# Patient Record
Sex: Female | Born: 1960 | Race: Black or African American | Hispanic: No | Marital: Single | Smoking: Current every day smoker
Health system: Southern US, Community
[De-identification: ages and names within clinical notes are randomized; demographics above are authoritative.]

## PROBLEM LIST (undated history)

## (undated) DIAGNOSIS — E739 Lactose intolerance, unspecified: Secondary | ICD-10-CM

## (undated) DIAGNOSIS — E079 Disorder of thyroid, unspecified: Secondary | ICD-10-CM

## (undated) DIAGNOSIS — I10 Essential (primary) hypertension: Secondary | ICD-10-CM

## (undated) HISTORY — PX: HIP ARTHROPLASTY: SHX981

## (undated) HISTORY — PX: THYROIDECTOMY: SHX17

---

## 2015-07-27 ENCOUNTER — Other Ambulatory Visit: Payer: Self-pay

## 2015-07-27 ENCOUNTER — Emergency Department (HOSPITAL_COMMUNITY)
Admission: EM | Admit: 2015-07-27 | Discharge: 2015-07-27 | Disposition: A | Discharge status: Home / Self Care | Payer: Self-pay | Attending: Emergency Medicine | Admitting: Emergency Medicine

## 2015-07-27 ENCOUNTER — Emergency Department (HOSPITAL_COMMUNITY): Payer: Self-pay

## 2015-07-27 ENCOUNTER — Encounter (HOSPITAL_COMMUNITY): Payer: Self-pay

## 2015-07-27 DIAGNOSIS — I158 Other secondary hypertension: Secondary | ICD-10-CM | POA: Insufficient documentation

## 2015-07-27 DIAGNOSIS — Z9114 Patient's other noncompliance with medication regimen: Secondary | ICD-10-CM | POA: Insufficient documentation

## 2015-07-27 DIAGNOSIS — I159 Secondary hypertension, unspecified: Secondary | ICD-10-CM

## 2015-07-27 DIAGNOSIS — F17210 Nicotine dependence, cigarettes, uncomplicated: Secondary | ICD-10-CM | POA: Insufficient documentation

## 2015-07-27 HISTORY — DX: Disorder of thyroid, unspecified: E07.9

## 2015-07-27 LAB — I-STAT TROPONIN, ED: Troponin i, poc: 0 ng/mL (ref 0.00–0.08)

## 2015-07-27 LAB — I-STAT CHEM 8, ED
BUN: 16 mg/dL (ref 6–20)
CREATININE: 0.8 mg/dL (ref 0.44–1.00)
Calcium, Ion: 1.31 mmol/L — ABNORMAL HIGH (ref 1.12–1.23)
Chloride: 107 mmol/L (ref 101–111)
GLUCOSE: 76 mg/dL (ref 65–99)
HEMATOCRIT: 46 % (ref 36.0–46.0)
Hemoglobin: 15.6 g/dL — ABNORMAL HIGH (ref 12.0–15.0)
POTASSIUM: 3.7 mmol/L (ref 3.5–5.1)
Sodium: 143 mmol/L (ref 135–145)
TCO2: 26 mmol/L (ref 0–100)

## 2015-07-27 LAB — TSH: TSH: 12.853 u[IU]/mL — AB (ref 0.350–4.500)

## 2015-07-27 LAB — T4, FREE: FREE T4: 0.58 ng/dL — AB (ref 0.61–1.12)

## 2015-07-27 MED ORDER — HYDROCHLOROTHIAZIDE 25 MG PO TABS: 25.0000 mg | ORAL_TABLET | Freq: Every day | ORAL | Status: DC

## 2015-07-27 MED ORDER — LEVOTHYROXINE SODIUM 100 MCG PO TABS: 100.0000 ug | ORAL_TABLET | Freq: Every day | ORAL | Status: DC

## 2015-07-27 NOTE — ED Notes (Signed)
Patient here with shortness of breath and feeling light headed for 2 weeks. States that she has recently moved to area and been out of synthroid x 2 weeks. Complains of fatigue as well

## 2015-07-27 NOTE — ED Notes (Signed)
MD at bedside. 

## 2015-07-27 NOTE — ED Provider Notes (Signed)
CSN: [id number]     Arrival date & time 07/27/15  0912 History   First MD Initiated Contact with Patient 07/27/15 [phone]     Chief Complaint  Patient presents with  . Shortness of Breath  . Dizziness      HPI Patient here with shortness of breath and feeling light headed for 2 weeks. States that she has recently moved to area and been out of synthroid x 2 weeks. Complains of fatigue as well Past Medical History  Diagnosis Date  . Thyroid disease    History reviewed. No pertinent past surgical history. No family history on file. Social History  Substance Use Topics  . Smoking status: Current Every Day Smoker    Types: Cigarettes  . Smokeless tobacco: None  . Alcohol Use: None   OB History    No data available     Review of Systems  All other systems reviewed and are negative.     Allergies  Penicillins  Home Medications   Prior to Admission medications   Medication Sig Start Date End Date Taking? Authorizing Provider  hydrochlorothiazide (HYDRODIURIL) 25 MG tablet Take 1 tablet (25 mg total) by mouth daily. 07/27/15   Nelva Nayobert Luchiano Viscomi, MD  levothyroxine (SYNTHROID, LEVOTHROID) 100 MCG tablet Take 1 tablet (100 mcg total) by mouth daily before breakfast. 07/27/15   Nelva Nayobert Dorianna Mckiver, MD  naproxen sodium (ANAPROX) 220 MG tablet Take 220 mg by mouth 2 (two) times daily as needed (pain).   Yes Historical Provider, MD   BP 169/87 mmHg  Pulse 52  Temp(Src) 98.1 F (36.7 C) (Oral)  Resp 20  Ht 5\' 5"  (1.651 m)  Wt 155 lb (70.308 kg)  BMI 25.79 kg/m2  SpO2 99% Physical Exam  Constitutional: She is oriented to person, place, and time. She appears well-developed and well-nourished. No distress.  HENT:  Head: Normocephalic and atraumatic.  Eyes: Pupils are equal, round, and reactive to light.  Neck: Normal range of motion.  Cardiovascular: Normal rate and intact distal pulses.   Pulmonary/Chest: No respiratory distress. She has no wheezes. She has no rales.  Abdominal: Normal  appearance. She exhibits no distension.  Musculoskeletal: Normal range of motion.  Neurological: She is alert and oriented to person, place, and time. No cranial nerve deficit.  Skin: Skin is warm and dry. No rash noted.  Psychiatric: She has a normal mood and affect. Her behavior is normal.  Nursing note and vitals reviewed.   ED Course  Procedures (including critical care time) Labs Review Labs Reviewed  T4, FREE - Abnormal; Notable for the following:    Free T4 0.58 (*)    All other components within normal limits  I-STAT CHEM 8, ED - Abnormal; Notable for the following:    Calcium, Ion 1.31 (*)    Hemoglobin 15.6 (*)    All other components within normal limits  TSH  I-STAT TROPOININ, ED    Imaging Review Dg Chest 2 View  07/27/2015  CLINICAL DATA:  Shortness of breath and lightheadedness for past 3 days, legs feel heavy, elevated blood pressure, off thyroid medication for 2 weeks, smoker EXAM: CHEST  2 VIEW COMPARISON:  Non FINDINGS: Normal heart size, mediastinal contours, and pulmonary vascularity. Lungs minimally hyperinflated but clear. No pulmonary infiltrate, pleural effusion or pneumothorax. Bones unremarkable. IMPRESSION: No acute abnormalities. Electronically Signed   By: Ulyses SouthwardMark  Boles M.D.   On: 07/27/2015 10:20   I have personally reviewed and evaluated these images and lab results as part of my  medical decision-making.   EKG Interpretation   Date/Time:  Saturday July 27 2015 09:21:15 EDT Ventricular Rate:  60 PR Interval:  124 QRS Duration: 80 QT Interval:  424 QTC Calculation: 424 R Axis:   72 Text Interpretation:  Normal sinus rhythm Possible Left atrial enlargement  Anterolateral infarct , age undetermined Abnormal ECG No previous tracing  Confirmed by Cecilie Heidel  MD, Sanaii Caporaso ([id number]) on 07/27/2015 11:33:31 AM      MDM   Final diagnoses:  Non compliance w medication regimen  Secondary hypertension, unspecified        Nelva Nayobert Kayley Zeiders, MD 07/27/15  1147

## 2015-07-27 NOTE — Discharge Instructions (Signed)

## 2015-07-27 NOTE — Care Management Note (Signed)
Case Management Note  Patient Details  Name: Oliver BarreMichelle Puerta MRN: [id number] Date of Birth: [birth year]  Subjective/Objective:    55 y.o. F seen in the ED today for elevated BP and need for Medication refills. CM received consult for Medication assist and PCP referral. Pt reports that she had [facility] her BP and Thyroid condition and was told she would not be able to be seen there for two weeks. CM [place] persue after discharge in order to establish a PCP. She understands that [facility] 9-10:30 am as pts are seen on a first come first served basis. She plans to go to the Medical Office at her part-time job,  where she reports she can have her BP taken on the days she works so she will have a log of her BPs to take to her initial visit when it is scheduled. I also offered that she can go to her local [facility], [facility] or other pharmacy and use an automated BP Cuff and log these BPs as well ( Using same time of day, same arm and same cuff).   Provided MD with $4.00 Medication list.              Action/Plan: No further CM needs at this time.    Expected Discharge Date:                  Expected Discharge Plan:     In-House Referral:     Discharge planning Services  CM Consult, Medication Assistance  Post Acute Care Choice:    Choice offered to:  Patient  DME Arranged:    DME Agency:     HH Arranged:    HH Agency:     Status of Service:  Completed, signed off  If discussed at [employer] Length of Stay Meetings, dates discussed:    Additional Comments:  Yvone NeuCrutchfield, Berlie Hatchel M, RN 07/27/2015, 10:19 AM

## 2015-07-27 NOTE — ED Notes (Addendum)
RN called Jeronimo NormaJeanie, case management since patient has recently moved here and has no insurance. She states she called [facility] and they were full.  States she needs synthroid and BP meds. Has smoked 0.5 pack/day for decades.

## 2015-07-27 NOTE — ED Notes (Signed)
PT ambulated with baseline gait; VSS; A&Ox3; no signs of distress; respirations even and unlabored; skin warm and dry; no questions upon discharge.  

## 2015-09-02 ENCOUNTER — Ambulatory Visit: Payer: Self-pay | Admitting: Family Medicine

## 2015-09-24 ENCOUNTER — Encounter: Payer: Self-pay | Admitting: Family Medicine

## 2015-09-24 ENCOUNTER — Ambulatory Visit (INDEPENDENT_AMBULATORY_CARE_PROVIDER_SITE_OTHER): Payer: Self-pay | Admitting: Family Medicine

## 2015-09-24 VITALS — BP 124/74 | HR 73 | Temp 98.6°F | Resp 18 | Ht 65.0 in | Wt 161.0 lb

## 2015-09-24 DIAGNOSIS — E039 Hypothyroidism, unspecified: Secondary | ICD-10-CM

## 2015-09-24 DIAGNOSIS — I10 Essential (primary) hypertension: Secondary | ICD-10-CM | POA: Insufficient documentation

## 2015-09-24 MED ORDER — LEVOTHYROXINE SODIUM 100 MCG PO TABS: 100.0000 ug | ORAL_TABLET | Freq: Every day | ORAL | 1 refills | Status: DC

## 2015-09-24 MED ORDER — HYDROCHLOROTHIAZIDE 25 MG PO TABS: 25.0000 mg | ORAL_TABLET | Freq: Every day | ORAL | 1 refills | Status: DC

## 2015-09-24 MED FILL — ?LEVOTHYROXINE 100 MCG TAB: 100 | 30 days supply | Qty: 30 | Fill #0

## 2015-09-24 MED FILL — HYDROCHLOROTHIAZIDE 25 MG T: 25 | 30 days supply | Qty: 30 | Fill #0

## 2015-09-24 NOTE — Progress Notes (Signed)
Maria BarreMichelle Fonder, is a 55 y.o. female  UEA:[id number]  YNW:[id number]  DOB - [birth year]  CC:  Chief Complaint  Patient presents with  . Establish Care       HPI: Maria BarreMichelle Boyle is a 55 y.o. female here to establish care. She has a history of hyperthyroidism, surgery with resultant hypothyroidisn and has been on synthroid but recently ran out. She was seen in last June in ED and found to be hypertensive. She was started on HCTZ 25 and restarted on synthroid 100  Her main reason for being here is to get refills on those medications. She currently does not have any type of financial assistance or insurance and would like to delay health maintenance until she arranges that. Her BP today is 124/74.  Health Maintenance:  She reports having PAP and mammogram in 2016. She gets tetanus and flu shots at her job. Her has BS in June was 1479. She does need colon cancer screening soon. She reports smoking 1/2 pack of cigarettes daily and is not ready to quit. She drinks alcohol only on special occasions and denies drug use. She reports a low salt diet, avoiding fried foods. Eat lots of fish and salads. She does not exercise regularly.  Allergies  Allergen Reactions  . Penicillins Rash   Past Medical History:  Diagnosis Date  . Thyroid disease    Current Outpatient Prescriptions on File Prior to Visit  Medication Sig Dispense Refill  . naproxen sodium (ANAPROX) 220 MG tablet Take 220 mg by mouth 2 (two) times daily as needed (pain).     No current facility-administered medications on file prior to visit.    No family history on file. Social History   Social History  . Marital status: Single    Spouse name: N/A  . Number of children: N/A  . Years of education: N/A   Occupational History  . Not on file.   Social History Main Topics  . Smoking status: Current Every Day Smoker    Types: Cigarettes  . Smokeless tobacco: Not on file  . Alcohol use Not on file  . Drug use: Unknown  .  Sexual activity: Not on file   Other Topics Concern  . Not on file   Social History Narrative  . No narrative on file    Review of Systems: Constitutional: Negative for fever, chills, appetite change, weight loss, fatigue. Positive for hot flashes and insomnia Skin: Negative for rashes or lesions of concern. HENT: Negative for ear pain, ear discharge.nose bleeds Eyes: Negative for pain, discharge, redness, itching and visual disturbance. Neck: Negative for pain, stiffness Respiratory: Negative for cough, shortness of breath,   Cardiovascular: Negative for chest pain, palpitations and leg swelling. Gastrointestinal: Negative for abdominal pain, nausea, vomiting, diarrhea, constipations Genitourinary: Negative for dysuria, urgency, frequency, hematuria,  Musculoskeletal: Negative for back pain, joint pain, joint  swelling, and gait problem.Negative for weakness.Positive for left hip pain (DJD) Neurological: Negative for dizziness, tremors, seizures, syncope,   light-headedness, numbness and headaches.  Hematological: Negative for easy bruising or bleeding Psychiatric/Behavioral: Negative for depression, anxiety, decreased concentration, confusion   Objective:   Vitals:   09/24/15 0910  BP: 124/74  Pulse: 73  Resp: 18  Temp: 98.6 F (37 C)    Physical Exam: Constitutional: Patient appears well-developed and well-nourished. No distress. HENT: Normocephalic, atraumatic, External right and left ear normal. Oropharynx is clear and moist.  Eyes: Conjunctivae and EOM are normal. PERRLA, no scleral icterus. Neck: Normal ROM. Neck supple.  No lymphadenopathy, No thyromegaly. CVS: RRR, S1/S2 +, no murmurs, no gallops, no rubs Pulmonary: Effort and breath sounds normal, no stridor, rhonchi, wheezes, rales.  Abdominal: Soft. Normoactive BS,, no distension, tenderness, rebound or guarding.  Musculoskeletal: Normal range of motion. No edema and no tenderness.  Neuro: Alert.Normal muscle  tone coordination. Non-focal Skin: Skin is warm and dry. No rash noted. Not diaphoretic. No erythema. No pallor. Psychiatric: Normal mood and affect. Behavior, judgment, thought content normal.  Lab Results  Component Value Date   HGB 15.6 (H) 07/27/2015   HCT 46.0 07/27/2015   Lab Results  Component Value Date   CREATININE 0.80 07/27/2015   BUN 16 07/27/2015   NA 143 07/27/2015   K 3.7 07/27/2015   CL 107 07/27/2015    No results found for: HGBA1C Lipid Panel  No results found for: CHOL, TRIG, HDL, CHOLHDL, VLDL, LDLCALC     Assessment and plan:   1. Essential hypertension  - hydrochlorothiazide (HYDRODIURIL) 25 MG tablet; Take 1 tablet (25 mg total) by mouth daily.  Dispense: 30 tablet; Refill: 1  2. Hypothyroidism, unspecified hypothyroidism type  - levothyroxine (SYNTHROID, LEVOTHROID) 100 MCG tablet; Take 1 tablet (100 mcg total) by mouth daily before breakfast.  Dispense: 30 tablet; Refill: 1   Return in about 3 months (around 12/25/2015).  The patient was given clear instructions to go to ER or return to medical center if symptoms don't improve, worsen or new problems develop. The patient verbalized understanding.    Henrietta HooverLinda C Mikeila Burgen FNP  09/24/2015, 9:44 AM

## 2015-09-24 NOTE — Patient Instructions (Signed)
Continue current medication Follow-up when you get insurance or orange card.

## 2015-09-24 NOTE — Progress Notes (Signed)
Patient is here to establish care.  Patient denies pain at this time.  Patient has taken medication today. Patient has eaten today.   

## 2015-10-29 MED FILL — ?LEVOTHYROXINE 100 MCG TAB: 100 | 30 days supply | Qty: 30 | Fill #1

## 2015-10-29 MED FILL — HYDROCHLOROTHIAZIDE 25 MG T: 25 | 30 days supply | Qty: 30 | Fill #1

## 2015-11-29 ENCOUNTER — Other Ambulatory Visit: Payer: Self-pay | Admitting: Family Medicine

## 2015-11-29 DIAGNOSIS — I10 Essential (primary) hypertension: Secondary | ICD-10-CM

## 2015-11-29 DIAGNOSIS — E039 Hypothyroidism, unspecified: Secondary | ICD-10-CM

## 2015-11-29 MED FILL — HYDROCHLOROTHIAZIDE 25 MG T: 25 | 30 days supply | Qty: 30 | Fill #0

## 2015-11-29 MED FILL — ?LEVOTHYROXINE 100 MCG TAB: 100 | 30 days supply | Qty: 30 | Fill #0

## 2015-12-24 ENCOUNTER — Ambulatory Visit: Payer: Self-pay | Admitting: Family Medicine

## 2016-01-02 MED FILL — HYDROCHLOROTHIAZIDE 25 MG T: 25 | 30 days supply | Qty: 30 | Fill #1

## 2016-01-02 MED FILL — ?LEVOTHYROXINE 100 MCG TAB: 100 | 30 days supply | Qty: 30 | Fill #1

## 2016-02-05 ENCOUNTER — Other Ambulatory Visit: Payer: Self-pay | Admitting: Family Medicine

## 2016-02-05 DIAGNOSIS — E039 Hypothyroidism, unspecified: Secondary | ICD-10-CM

## 2016-02-05 DIAGNOSIS — I10 Essential (primary) hypertension: Secondary | ICD-10-CM

## 2016-02-07 MED FILL — ?LEVOTHYROXINE 100 MCG TAB: 100 | 30 days supply | Qty: 30 | Fill #0

## 2016-02-07 MED FILL — HYDROCHLOROTHIAZIDE 25 MG T: 25 | 30 days supply | Qty: 30 | Fill #0

## 2016-03-09 MED FILL — HYDROCHLOROTHIAZIDE 25 MG T: 25 | 30 days supply | Qty: 30 | Fill #1

## 2016-03-09 MED FILL — ?LEVOTHYROXINE 100 MCG TAB: 100 | 30 days supply | Qty: 30 | Fill #1

## 2016-04-09 ENCOUNTER — Telehealth: Payer: Self-pay

## 2016-04-09 DIAGNOSIS — I10 Essential (primary) hypertension: Secondary | ICD-10-CM

## 2016-04-09 DIAGNOSIS — E039 Hypothyroidism, unspecified: Secondary | ICD-10-CM

## 2016-04-09 MED ORDER — LEVOTHYROXINE SODIUM 100 MCG PO TABS: ORAL_TABLET | ORAL | 1 refills | Status: DC

## 2016-04-09 MED ORDER — HYDROCHLOROTHIAZIDE 25 MG PO TABS: 25.0000 mg | ORAL_TABLET | Freq: Every day | ORAL | 1 refills | Status: DC

## 2016-04-09 NOTE — Telephone Encounter (Signed)
Called patient and schedule an appointment and refills were sent into pharmacy that will last until appointment

## 2016-04-10 MED FILL — LEVOTHYROXINE 100 MCG TAB: 100 | 30 days supply | Qty: 30 | Fill #0

## 2016-04-10 MED FILL — HYDROCHLOROTHIAZIDE 25 MG T: 25 | 30 days supply | Qty: 30 | Fill #0

## 2016-04-20 ENCOUNTER — Encounter (HOSPITAL_COMMUNITY): Payer: Self-pay | Admitting: Emergency Medicine

## 2016-04-20 ENCOUNTER — Emergency Department (HOSPITAL_COMMUNITY): Payer: Self-pay

## 2016-04-20 ENCOUNTER — Emergency Department (HOSPITAL_COMMUNITY)
Admission: EM | Admit: 2016-04-20 | Discharge: 2016-04-20 | Disposition: A | Discharge status: Home / Self Care | Payer: Self-pay | Attending: Emergency Medicine | Admitting: Emergency Medicine

## 2016-04-20 DIAGNOSIS — I10 Essential (primary) hypertension: Secondary | ICD-10-CM | POA: Insufficient documentation

## 2016-04-20 DIAGNOSIS — M25552 Pain in left hip: Secondary | ICD-10-CM | POA: Insufficient documentation

## 2016-04-20 DIAGNOSIS — F17210 Nicotine dependence, cigarettes, uncomplicated: Secondary | ICD-10-CM | POA: Insufficient documentation

## 2016-04-20 HISTORY — DX: Essential (primary) hypertension: I10

## 2016-04-20 MED ORDER — OXYCODONE-ACETAMINOPHEN 5-325 MG PO TABS: 1.0000 | ORAL_TABLET | ORAL | 0 refills | Status: AC | PRN

## 2016-04-20 MED ORDER — OXYCODONE-ACETAMINOPHEN 5-325 MG PO TABS
1.0000 | ORAL_TABLET | Freq: Once | ORAL | Status: AC
  Administered 2016-04-20: 1 via ORAL
  Filled 2016-04-20: qty 1

## 2016-04-20 MED ORDER — CYCLOBENZAPRINE HCL 10 MG PO TABS: 10.0000 mg | ORAL_TABLET | Freq: Two times a day (BID) | ORAL | 0 refills | Status: DC | PRN

## 2016-04-20 NOTE — ED Notes (Signed)
States since Friday her whole left leg has given her problems, cannot bear weight and pain is in groin and  Radiates down entire leg, aching, no position makes it better, she cannot sleep and it hurts so bad to bear weigfht

## 2016-04-20 NOTE — Progress Notes (Signed)
Orthopedic Tech Progress Note Patient Details:  Maria BarreMichelle Boyle Oct 29, 1960 [id number]  Ortho Devices Type of Ortho Device: Crutches Ortho Device/Splint Location: applied crutches and training for pt left leg.  pt AMBULATED fair. Daughter rushed crutch training because she said she works with physical therapy and she will train her mother when she gets home.  they have to go because she has an appointment. daughter reacted when I attempted to help pt up to stand and when i attempted to tie the pts shoe. Daughter insisted that she will do it. Ortho Device/Splint Interventions: Application, Adjustment   Alvina ChouWilliams, Jametta Moorehead C 04/20/2016, 11:27 AM

## 2016-04-20 NOTE — ED Triage Notes (Signed)
Pt sts left hip and lower back pain with radiation down left leg

## 2016-04-20 NOTE — ED Provider Notes (Signed)
[facility]-EMERGENCY DEPT Provider Note   CSN: [id number] Arrival date & time: 04/20/16  [id number]  By signing my name below, I, Maria Boyle, attest that this documentation has been prepared under the direction and in the presence of Audry Piliyler Isiaha Greenup, PA-C. Electronically Signed: Doreatha MartinEva Boyle, ED Scribe. 04/20/16. 9:28 AM.    History   Chief Complaint Chief Complaint  Patient presents with  . Hip Pain    HPI Maria Boyle is a 56 y.o. female who presents to the Emergency Department complaining of moderate, sharp left lower back pain with radiation to the left hip and knee that began 2 days ago. Pt states a few days ago, she got up from sleep to use the restroom, felt her leg "give out" and has had pain since. She denies recent fall, injury, trauma, heavy lifting. Pt also notes her left knee was swollen initially; however that has now resolved. Pt is ambulatory with minimal difficulty. She states her pain is worsened with weight bearing, ambulation and with certain positions. Pt reports some relief of pain with elevating the left leg. No h/o back surgery, DM. She denies bowel or bladder incontinence, saddle anesthesia, dysuria, hematuria, frequency, urgency. She also denies numbness, focal weakness or paresthesia of the lower extremities.    The history is provided by the patient. No language interpreter was used.    Past Medical History:  Diagnosis Date  . Hypertension   . Thyroid disease     Patient Active Problem List   Diagnosis Date Noted  . Essential hypertension 09/24/2015    History reviewed. No pertinent surgical history.  OB History    No data available       Home Medications    Prior to Admission medications   Medication Sig Start Date End Date Taking? Authorizing Provider  hydrochlorothiazide (HYDRODIURIL) 25 MG tablet Take 1 tablet (25 mg total) by mouth daily. 04/09/16   Henrietta HooverLinda C Bernhardt, NP  levothyroxine (SYNTHROID, LEVOTHROID) 100 MCG tablet TAKE 1 TABLET BY MOUTH DAILY  BEFORE BREAKFAST. 04/09/16   Henrietta HooverLinda C Bernhardt, NP  naproxen sodium (ANAPROX) 220 MG tablet Take 220 mg by mouth 2 (two) times daily as needed (pain).    Historical Provider, MD    Family History History reviewed. No pertinent family history.  Social History Social History  Substance Use Topics  . Smoking status: Current Every Day Smoker    Types: Cigarettes  . Smokeless tobacco: Never Used  . Alcohol use No     Allergies   Penicillins   Review of Systems Review of Systems  Gastrointestinal:       No bowel incontinence   Genitourinary: Negative for dysuria, frequency, hematuria and urgency.       No bladder incontinence, saddle anesthesia  Musculoskeletal: Positive for back pain.  Neurological: Negative for weakness and numbness.       No tingling    Physical Exam Updated Vital Signs BP (!) 162/89 (BP Location: Right Arm)   Pulse 64   Temp 98.3 F (36.8 C) (Oral)   Resp 18   SpO2 100%   Physical Exam  Constitutional: She is oriented to person, place, and time. She appears well-developed and well-nourished. No distress.  HENT:  Head: Normocephalic and atraumatic.  Mouth/Throat: Oropharynx is clear and moist.  Eyes: Conjunctivae are normal.  Neck: Normal range of motion. Neck supple. No spinous process tenderness and no muscular tenderness present.  Cardiovascular: Normal rate and intact distal pulses.   Pulmonary/Chest: Effort normal. No respiratory distress.  Musculoskeletal: She exhibits tenderness. She exhibits no edema.  TTP left lower lumbar musculature. No midline spinous process tenderness.  Left hip: Pain with internal and external rotation. No palpable visible deformities. NVI and DPs appreciated.   Neurological: She is alert and oriented to person, place, and time. She has normal strength. She displays normal reflexes. No sensory deficit.  Strength lower extremities 5/5 and equal bilateral. Sensation intact. Normal gait.  DTRs intact.   Skin: Skin is  warm and dry. No rash noted. She is not diaphoretic.  Psychiatric: She has a normal mood and affect. Her behavior is normal.  Nursing note and vitals reviewed.   ED Treatments / Results   DIAGNOSTIC STUDIES: Oxygen Saturation is 100% on RA, normal by my interpretation.    COORDINATION OF CARE: 9:24 AM Discussed treatment plan with pt at bedside which includes XR and pt agreed to plan.  10:08 AM Case discussed with attending provider Dr. Claiborne Rigg, who recommends ortho follow up.    Radiology Dg Hip Unilat W Or Wo Pelvis 2-3 Views Left  Result Date: 04/20/2016 CLINICAL DATA:  Left groin pain EXAM: DG HIP (WITH OR WITHOUT PELVIS) 2-3V LEFT COMPARISON:  None. FINDINGS: There is severe narrowing of the superior left hip joint with juxta-articular cystic and sclerotic change. There is some flattening of the articular surface of the superior femoral head. No acute fracture. No dislocation. 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 No breakage or loosening of the hardware. 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 Underlying avascular necrosis of the femoral head has the instigating factor cannot be excluded. No acute bony injury. Electronically Signed   By: Jolaine Click M.D.   On: 04/20/2016 09:59    Procedures Procedures (including critical care time)  Medications Ordered in ED Medications  oxyCODONE-acetaminophen (PERCOCET/ROXICET) 5-325 MG per tablet 1 tablet (1 tablet Oral Given 04/20/16 0933)     Initial Impression / Assessment and Plan / ED Course  I have reviewed the triage vital signs and the nursing notes.  Pertinent imaging results that were available during my care of the patient were reviewed by me and considered in my medical decision making (see chart for details).     I have reviewed the relevant imaging studies. I have reviewed the relevant previous healthcare records. I obtained HPI from historian. Patient discussed with  supervising physician.    ED Course: Left hip XR  Assessment: Patient is a 56 y.o. female with a hx of HTN who presents to the ED with back pain. No neurological deficits appreciated. Patient is ambulatory. No warning symptoms of back pain including: fecal incontinence, urinary retention or overflow incontinence, night sweats, waking from sleep with back pain, unexplained fevers or weight loss, h/o cancer, IVDU, recent trauma. No concern for cauda equina, epidural abscess, or other serious cause of back pain.   XR with possible avascular necrosis of the left hip. Will refer to orthopedics for further management. Will provide with crutches and instructions to weight bear as tolerated.  Disposition/Plan:  D/c with rx for Flexeril and 15 tablets Percocet. Reviewed [place] drug database. Additional Verbal discharge instructions given and discussed with patient. Pt Instructed to f/u with orthopedics in the next week for evaluation and treatment of symptoms. Return precautions given. Pt acknowledges and agrees with plan  Supervising Physician Alvira Monday, MD    Final Clinical Impressions(s) / ED Diagnoses   Final diagnoses:  Left hip pain    New Prescriptions New Prescriptions  No medications on file   I personally performed the services described in this documentation, which was scribed in my presence. The recorded information has been reviewed and is accurate.    Audry Pili, PA-C 04/20/16 1038    Alvira Monday, MD 04/20/16 2317

## 2016-04-20 NOTE — ED Notes (Signed)
Pt was fitted for crutches  By ortho

## 2016-04-20 NOTE — Discharge Instructions (Addendum)
Please read and follow all provided instructions.  Your diagnoses today include:  1. Left hip pain     Tests performed today include: Vital signs - see below for your results today  Medications prescribed:   Take any prescribed medications only as directed.  Home care instructions:  Follow any educational materials contained in this packet Please rest, use ice or heat on your back for the next several days Do not lift, push, pull anything more than 10 pounds for the next week  WEIGHT BEAR AS TOLERATED WITH CRUTCHES  Follow-up instructions: Please follow-up with your primary care provider in the next 1 week for further evaluation of your symptoms.   Return instructions:  SEEK IMMEDIATE MEDICAL ATTENTION IF YOU HAVE: New numbness, tingling, weakness, or problem with the use of your arms or legs Severe back pain not relieved with medications Loss control of your bowels or bladder Increasing pain in any areas of the body (such as chest or abdominal pain) Shortness of breath, dizziness, or fainting.  Worsening nausea (feeling sick to your stomach), vomiting, fever, or sweats Any other emergent concerns regarding your health   Additional Information:  Your vital signs today were: BP (!) 162/89 (BP Location: Right Arm)    Pulse 64    Temp 98.3 F (36.8 C) (Oral)    Resp 18    SpO2 100%  If your blood pressure (BP) was elevated above 135/85 this visit, please have this repeated by your doctor within one month. --------------

## 2016-04-23 ENCOUNTER — Ambulatory Visit: Payer: Self-pay | Admitting: Family Medicine

## 2016-05-11 MED FILL — HYDROCHLOROTHIAZIDE 25 MG T: 25 | 30 days supply | Qty: 30 | Fill #1

## 2016-05-11 MED FILL — LEVOTHYROXINE 100 MCG TAB: 100 | 30 days supply | Qty: 30 | Fill #1

## 2016-05-22 ENCOUNTER — Encounter: Payer: Self-pay | Admitting: Family Medicine

## 2016-05-22 ENCOUNTER — Ambulatory Visit (INDEPENDENT_AMBULATORY_CARE_PROVIDER_SITE_OTHER): Payer: Self-pay | Admitting: Family Medicine

## 2016-05-22 VITALS — BP 134/80 | HR 77 | Temp 98.4°F | Resp 16 | Ht 65.0 in | Wt 160.0 lb

## 2016-05-22 DIAGNOSIS — R21 Rash and other nonspecific skin eruption: Secondary | ICD-10-CM

## 2016-05-22 DIAGNOSIS — M25552 Pain in left hip: Secondary | ICD-10-CM

## 2016-05-22 DIAGNOSIS — E039 Hypothyroidism, unspecified: Secondary | ICD-10-CM

## 2016-05-22 DIAGNOSIS — I10 Essential (primary) hypertension: Secondary | ICD-10-CM

## 2016-05-22 LAB — COMPLETE METABOLIC PANEL WITH GFR
ALBUMIN: 4.3 g/dL (ref 3.6–5.1)
ALK PHOS: 83 U/L (ref 33–130)
ALT: 8 U/L (ref 6–29)
AST: 13 U/L (ref 10–35)
BUN: 15 mg/dL (ref 7–25)
CHLORIDE: 105 mmol/L (ref 98–110)
CO2: 30 mmol/L (ref 20–31)
Calcium: 11 mg/dL — ABNORMAL HIGH (ref 8.6–10.4)
Creat: 0.87 mg/dL (ref 0.50–1.05)
GFR, EST AFRICAN AMERICAN: 87 mL/min (ref >=60)
GFR, EST NON AFRICAN AMERICAN: 75 mL/min (ref >=60)
Glucose, Bld: 87 mg/dL (ref 65–99)
POTASSIUM: 3.7 mmol/L (ref 3.5–5.3)
Sodium: 141 mmol/L (ref 135–146)
Total Bilirubin: 0.9 mg/dL (ref 0.2–1.2)
Total Protein: 7.1 g/dL (ref 6.1–8.1)

## 2016-05-22 LAB — CBC WITH DIFFERENTIAL/PLATELET
BASOS PCT: 0 %
Basophils Absolute: 0 cells/uL (ref 0–200)
EOS ABS: 142 {cells}/uL (ref 15–500)
Eosinophils Relative: 2 %
HEMATOCRIT: 42.8 % (ref 35.0–45.0)
Hemoglobin: 14.1 g/dL (ref 11.7–15.5)
LYMPHS PCT: 44 %
Lymphs Abs: 3124 cells/uL (ref 850–3900)
MCH: 31.3 pg (ref 27.0–33.0)
MCHC: 32.9 g/dL (ref 32.0–36.0)
MCV: 95.1 fL (ref 80.0–100.0)
MONO ABS: 426 {cells}/uL (ref 200–950)
MONOS PCT: 6 %
MPV: 11.3 fL (ref 7.5–12.5)
NEUTROS PCT: 48 %
Neutro Abs: 3408 cells/uL (ref 1500–7800)
Platelets: 258 10*3/uL (ref 140–400)
RBC: 4.5 MIL/uL (ref 3.80–5.10)
RDW: 14 % (ref 11.0–15.0)
WBC: 7.1 10*3/uL (ref 3.8–10.8)

## 2016-05-22 LAB — POCT URINALYSIS DIP (DEVICE)
BILIRUBIN URINE: NEGATIVE
GLUCOSE, UA: NEGATIVE mg/dL
Hgb urine dipstick: NEGATIVE
KETONES UR: NEGATIVE mg/dL
Leukocytes, UA: NEGATIVE
Nitrite: NEGATIVE
Protein, ur: NEGATIVE mg/dL
Specific Gravity, Urine: 1.025 (ref 1.005–1.030)
Urobilinogen, UA: 1 mg/dL (ref 0.0–1.0)
pH: 5.5 (ref 5.0–8.0)

## 2016-05-22 LAB — LIPID PANEL
Cholesterol: 263 mg/dL — ABNORMAL HIGH (ref <200)
HDL: 56 mg/dL (ref >50)
LDL Cholesterol: 186 mg/dL — ABNORMAL HIGH (ref <100)
TRIGLYCERIDES: 107 mg/dL (ref <150)
Total CHOL/HDL Ratio: 4.7 Ratio (ref <5.0)
VLDL: 21 mg/dL (ref <30)

## 2016-05-22 MED ORDER — CYCLOBENZAPRINE HCL 10 MG PO TABS: 10.0000 mg | ORAL_TABLET | Freq: Three times a day (TID) | ORAL | 0 refills | Status: DC | PRN

## 2016-05-22 MED ORDER — HYDROCHLOROTHIAZIDE 25 MG PO TABS: 25.0000 mg | ORAL_TABLET | Freq: Every day | ORAL | 1 refills | Status: DC

## 2016-05-22 MED ORDER — CLOTRIMAZOLE-BETAMETHASONE 1-0.05 % EX CREA: 1.0000 "application " | TOPICAL_CREAM | Freq: Two times a day (BID) | CUTANEOUS | 0 refills | Status: DC

## 2016-05-22 MED ORDER — ACETAMINOPHEN 500 MG PO TABS: 1000.0000 mg | ORAL_TABLET | Freq: Four times a day (QID) | ORAL | 0 refills | Status: DC | PRN

## 2016-05-22 MED ORDER — MELOXICAM 15 MG PO TABS
15.0000 mg | ORAL_TABLET | Freq: Every day | ORAL | 2 refills | Status: DC
Start: 2016-05-22 — End: 2017-06-21

## 2016-05-22 NOTE — Patient Instructions (Addendum)
Please complete the [facility] Health Patient Assistance program in order to obtain a referral for orthopedic surgery.  You may fax the form that you needed completed for your license to my attention and indicate where the form needs to be returned to.   Take Meloxicam once daily for hip pain. You may take Tylenol 1,000 mg (2 tablets) every 6 hours for pain relief. Take  Flexeril 10 mg only when significant pain occurs and if you are able to obtain sleep and are not driving.        Hypothyroidism Hypothyroidism is a disorder of the thyroid. The thyroid is a large gland that is located in the lower front of the neck. The thyroid releases hormones that control how the body works. With hypothyroidism, the thyroid does not make enough of these hormones. What are the causes? Causes of hypothyroidism may include:  Viral infections.  Pregnancy.  Your own defense system (immune system) attacking your thyroid.  Certain medicines.  Birth defects.  Past radiation treatments to your head or neck.  Past treatment with radioactive iodine.  Past surgical removal of part or all of your thyroid.  Problems with the gland that is located in the center of your brain (pituitary). What are the signs or symptoms? Signs and symptoms of hypothyroidism may include:  Feeling as though you have no energy (lethargy).  Inability to tolerate cold.  Weight gain that is not explained by a change in diet or exercise habits.  Dry skin.  Coarse hair.  Menstrual irregularity.  Slowing of thought processes.  Constipation.  Sadness or depression. How is this diagnosed? Your health care provider may diagnose hypothyroidism with blood tests and ultrasound tests. How is this treated? Hypothyroidism is treated with medicine that replaces the hormones that your body does not make. After you begin treatment, it may take several weeks for symptoms to go away. Follow these instructions at home:  Take  medicines only as directed by your health care provider.  If you start taking any new medicines, tell your health care provider.  Keep all follow-up visits as directed by your health care provider. This is important. As your condition improves, your dosage needs may change. You will need to have blood tests regularly so that your health care provider can watch your condition. Contact a health care provider if:  Your symptoms do not get better with treatment.  You are taking thyroid replacement medicine and:  You sweat excessively.  You have tremors.  You feel anxious.  You lose weight rapidly.  You cannot tolerate heat.  You have emotional swings.  You have diarrhea.  You feel weak. Get help right away if:  You develop chest pain.  You develop an irregular heartbeat.  You develop a rapid heartbeat. This information is not intended to replace advice given to you by your health care provider. Make sure you discuss any questions you have with your health care provider. Document Released: 01/19/2005 Document Revised: 06/27/2015 Document Reviewed: 06/06/2013 Elsevier Interactive Patient Education  2017 Elsevier Inc.  Hypertension Hypertension is another name for high blood pressure. High blood pressure forces your heart to work harder to pump blood. This can cause problems over time. There are two numbers in a blood pressure reading. There is a top number (systolic) over a bottom number (diastolic). It is best to have a blood pressure below 120/80. Healthy choices can help lower your blood pressure. You may need medicine to help lower your blood pressure if:  Your  blood pressure cannot be lowered with healthy choices.  Your blood pressure is higher than 130/80. Follow these instructions at home: Eating and drinking   If directed, follow the DASH eating plan. This diet includes:  Filling half of your plate at each meal with fruits and vegetables.  Filling one quarter of  your plate at each meal with whole grains. Whole grains include whole wheat pasta, brown rice, and whole grain bread.  Eating or drinking low-fat dairy products, such as skim milk or low-fat yogurt.  Filling one quarter of your plate at each meal with low-fat (lean) proteins. Low-fat proteins include fish, skinless chicken, eggs, beans, and tofu.  Avoiding fatty meat, cured and processed meat, or chicken with skin.  Avoiding premade or processed food.  Eat less than 1,500 mg of salt (sodium) a day.  Limit alcohol use to no more than 1 drink a day for nonpregnant women and 2 drinks a day for men. One drink equals 12 oz of beer, 5 oz of wine, or 1 oz of hard liquor. Lifestyle   Work with your doctor to stay at a healthy weight or to lose weight. Ask your doctor what the best weight is for you.  Get at least 30 minutes of exercise that causes your heart to beat faster (aerobic exercise) most days of the week. This may include walking, swimming, or biking.  Get at least 30 minutes of exercise that strengthens your muscles (resistance exercise) at least 3 days a week. This may include lifting weights or pilates.  Do not use any products that contain nicotine or tobacco. This includes cigarettes and e-cigarettes. If you need help quitting, ask your doctor.  Check your blood pressure at home as told by your doctor.  Keep all follow-up visits as told by your doctor. This is important. Medicines   Take over-the-counter and prescription medicines only as told by your doctor. Follow directions carefully.  Do not skip doses of blood pressure medicine. The medicine does not work as well if you skip doses. Skipping doses also puts you at risk for problems.  Ask your doctor about side effects or reactions to medicines that you should watch for. Contact a doctor if:  You think you are having a reaction to the medicine you are taking.  You have headaches that keep coming back (recurring).  You  feel dizzy.  You have swelling in your ankles.  You have trouble with your vision. Get help right away if:  You get a very bad headache.  You start to feel confused.  You feel weak or numb.  You feel faint.  You get very bad pain in your:  Chest.  Belly (abdomen).  You throw up (vomit) more than once.  You have trouble breathing. Summary  Hypertension is another name for high blood pressure.  Making healthy choices can help lower blood pressure. If your blood pressure cannot be controlled with healthy choices, you may need to take medicine. This information is not intended to replace advice given to you by your health care provider. Make sure you discuss any questions you have with your health care provider. Document Released: 07/08/2007 Document Revised: 12/18/2015 Document Reviewed: 12/18/2015 Elsevier Interactive Patient Education  2017 Elsevier Inc.        Hip Pain The hip is the joint between the upper legs and the lower pelvis. The bones, cartilage, tendons, and muscles of your hip joint support your body and allow you to move around. Hip pain can  range from a minor ache to severe pain in one or both of your hips. The pain may be felt on the inside of the hip joint near the groin, or the outside near the buttocks and upper thigh. You may also have swelling or stiffness. Follow these instructions at home: Managing pain, stiffness, and swelling   If directed, apply ice to the injured area.  Put ice in a plastic bag.  Place a towel between your skin and the bag.  Leave the ice on for 20 minutes, 2-3 times a day  Sleep with a pillow between your legs on your most comfortable side.  Avoid any activities that cause pain. General instructions   Take over-the-counter and prescription medicines only as told by your health care provider.  Do any exercises as told by your health care provider.  Record the following:  How often you have hip pain.  The  location of your pain.  What the pain feels like.  What makes the pain worse.  Keep all follow-up visits as told by your health care provider. This is important. Contact a health care provider if:  You cannot put weight on your leg.  Your pain or swelling continues or gets worse after one week.  It gets harder to walk.  You have a fever. Get help right away if:  You fall.  You have a sudden increase in pain and swelling in your hip.  Your hip is red or swollen or very tender to touch. Summary  Hip pain can range from a minor ache to severe pain in one or both of your hips.  The pain may be felt on the inside of the hip joint near the groin, or the outside near the buttocks and upper thigh.  Avoid any activities that cause pain.  Record how often you have hip pain, the location of the pain, what makes it worse and what it feels like. This information is not intended to replace advice given to you by your health care provider. Make sure you discuss any questions you have with your health care provider. Document Released: 07/09/2009 Document Revised: 12/23/2015 Document Reviewed: 12/23/2015 Elsevier Interactive Patient Education  2017 [employer].

## 2016-05-22 NOTE — Progress Notes (Signed)
Patient ID: Maria Boyle, female    DOB: 1960-02-23, 56 y.o.   MRN: [id number]  PCP: Joaquin Courts, FNP  Chief Complaint  Patient presents with  . Establish Care    paperwork for bus driver    Subjective:  HPI  Maria Boyle is a 56 y.o. female presents to establish care and routine wellness exam. Medical problems include: Chronic left hip pain, Hypertension, and Hypothyroidism. She last seen in clinic 09/2015. Her last thyroid panel was evaluated in 07/2015. She was recently evaluated at [facility] ED department for left hip pain. She reports that she was advised that her hip would require surgery to correct the chronic pain caused by osteoarthritis. Right hip was replaced several years ago in [place]. 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 Requests to be screened today for diabetes as she recently began to experience neuropathy in her toes over 1 month ago. She reports having a strong family hx of diabetes. Diagnosed with hypertension a little over year ago and reports compliance with medication.  Recently broke out in small rash over the last couple days with red spots on her arms. Denies any recent changes in detergent, foods, or cosmetic products. The rash is on the her bilateral forearms only. Denies itching. Only small red bumps present. Maria Boyle denies any recent headaches, chest pain, shortness of breath, or dizziness.  Social History   Social History  . Marital status: Single    Spouse name: N/A  . Number of children: N/A  . Years of education: N/A   Occupational History  . Not on file.   Social History Main Topics  . Smoking status: Current Every Day Smoker    Types: Cigarettes  . Smokeless tobacco: Never Used  . Alcohol use No  . Drug use: No  . Sexual activity: Not on file   Other Topics Concern  . Not on file   Social History Narrative  . No narrative on file    History reviewed. No pertinent  family history.   Review of Systems  See HPI  Patient Active Problem List   Diagnosis Date Noted  . Essential hypertension 09/24/2015    Allergies  Allergen Reactions  . Penicillins Rash    Prior to Admission medications   Medication Sig Start Date End Date Taking? Authorizing Provider  hydrochlorothiazide (HYDRODIURIL) 25 MG tablet Take 1 tablet (25 mg total) by mouth daily. 04/09/16  Yes Henrietta Hoover, NP  levothyroxine (SYNTHROID, LEVOTHROID) 100 MCG tablet TAKE 1 TABLET BY MOUTH DAILY BEFORE BREAKFAST. 04/09/16  Yes Henrietta Hoover, NP  cyclobenzaprine (FLEXERIL) 10 MG tablet Take 1 tablet (10 mg total) by mouth 2 (two) times daily as needed for muscle spasms. Patient not taking: Reported on 05/22/2016 04/20/16   Audry Pili, PA-C  naproxen sodium (ANAPROX) 220 MG tablet Take 220 mg by mouth 2 (two) times daily as needed (pain).    Historical Provider, MD    Past Medical, Surgical Family and Social History reviewed and updated.    Objective:   Today's Vitals   05/22/16 1444  BP: 134/80  Pulse: 77  Resp: 16  Temp: 98.4 F (36.9 C)  TempSrc: Oral  SpO2: 99%  Weight: 160 lb (72.6 kg)  Height:  (1.651 m)    Wt Readings from Last 3 Encounters:  05/22/16 160 lb (72.6 kg)  09/24/15 161 lb (73 kg)  07/27/15 155 lb (70.3 kg)    Physical Exam  Constitutional: She is oriented to person, place, and time. She appears well-developed and well-nourished.  HENT:  Head: Normocephalic and atraumatic.  Nose: Nose normal.  Mouth/Throat: Oropharynx is clear and moist.  Eyes: Conjunctivae are normal. Pupils are equal, round, and reactive to light.  Neck: Normal range of motion. Neck supple. No thyromegaly present.  Cardiovascular: Normal rate, normal heart sounds and intact distal pulses.   Pulmonary/Chest: Effort normal and breath sounds normal.  Musculoskeletal: She exhibits no tenderness.       Left hip: She exhibits decreased range of motion.  Neurological: She is  alert and oriented to person, place, and time.  Skin: Skin is warm and dry.  Psychiatric: She has a normal mood and affect. Her behavior is normal. Judgment and thought content normal.      Assessment & Plan:  1. Essential hypertension, controlled today - hydrochlorothiazide (HYDRODIURIL) 25 MG tablet; Take 1 tablet (25 mg total) by mouth daily.  Dispense: 90 tablet; Refill: 1 - COMPLETE METABOLIC PANEL WITH GFR - CBC with Differential - Lipid panel - Hemoglobin A1c  2. Hypothyroidism, unspecified type - Thyroid Panel With TSH-TSH 0.30, reducing Levothyroxine to 75 mcg from 100 mcg. -Return in 6 week for a repeat TSH  3. Pain of left hip joint -Complete financial assistance form in order for you to be referred to orthopedics for further evaluation of hip pain -Start Meloxicam 15 mg daily -Intermitediate severe pain, take cyclobenzaprine 10 mg 3 times daily as needed. -Mild to moderate pain take acetaminophen 1,000 mg every 6 hours as needed.  4. Skin Rash -dermatitis likely-trial with Lotrisone cream, twice daily   RTC: 6 months for routine follow-up and to address overdue health maintenance items. RTC: 6 weeks for repeat TSH.   Godfrey Pick. Tiburcio Pea, MSN, [facility] [address] [place], [place] [address] [phone]

## 2016-05-23 LAB — THYROID PANEL WITH TSH
FREE THYROXINE INDEX: 3.5 (ref 1.4–3.8)
T3 UPTAKE: 32 % (ref 22–35)
T4 TOTAL: 11 ug/dL (ref 4.5–12.0)
TSH: 0.3 mIU/L — ABNORMAL LOW

## 2016-05-24 DIAGNOSIS — E039 Hypothyroidism, unspecified: Secondary | ICD-10-CM | POA: Insufficient documentation

## 2016-05-24 MED ORDER — LEVOTHYROXINE SODIUM 75 MCG PO TABS: 75.0000 ug | ORAL_TABLET | Freq: Every day | ORAL | 0 refills | Status: DC

## 2016-05-26 LAB — HEMOGLOBIN A1C
HEMOGLOBIN A1C: 4.9 % (ref <5.7)
MEAN PLASMA GLUCOSE: 94 mg/dL

## 2016-06-18 MED FILL — CLOTRIMAZOLE-BETAMETHASONE: 1-0.05 | 15 days supply | Qty: 30 | Fill #0

## 2016-06-18 MED FILL — ?MELOXICAM 15 MG TAB: 15 MG | 30 days supply | Qty: 30 | Fill #0

## 2016-06-18 MED FILL — ?LEVOTHYROXINE 75 MCG TABLE: 75 | 30 days supply | Qty: 30 | Fill #0

## 2016-06-18 MED FILL — CYCLOBENZAPRINE 10 MG TAB: 10 | 30 days supply | Qty: 90 | Fill #0

## 2016-06-22 ENCOUNTER — Telehealth: Payer: Self-pay

## 2016-06-22 DIAGNOSIS — I10 Essential (primary) hypertension: Secondary | ICD-10-CM

## 2016-06-22 MED ORDER — HYDROCHLOROTHIAZIDE 25 MG PO TABS: 25.0000 mg | ORAL_TABLET | Freq: Every day | ORAL | 1 refills | Status: DC

## 2016-06-22 MED FILL — HYDROCHLOROTHIAZIDE 25 MG T: 25 | 30 days supply | Qty: 30 | Fill #0

## 2016-06-22 NOTE — Telephone Encounter (Signed)
Medication sent to [facility].

## 2016-07-03 ENCOUNTER — Other Ambulatory Visit: Payer: Self-pay

## 2016-07-20 MED FILL — ?LEVOTHYROXINE 75 MCG TABLE: 75 | 30 days supply | Qty: 30 | Fill #1

## 2016-07-20 MED FILL — HYDROCHLOROTHIAZIDE 25 MG T: 25 | 30 days supply | Qty: 30 | Fill #1

## 2016-08-19 MED FILL — LEVOTHYROXINE 75 MCG TABLET: 75 | 30 days supply | Qty: 30 | Fill #2

## 2016-08-19 MED FILL — HYDROCHLOROTHIAZIDE 25 MG T: 25 | 30 days supply | Qty: 30 | Fill #2

## 2016-09-03 ENCOUNTER — Telehealth: Payer: Self-pay

## 2016-09-03 ENCOUNTER — Encounter: Payer: Self-pay | Admitting: Family Medicine

## 2016-09-03 ENCOUNTER — Ambulatory Visit (INDEPENDENT_AMBULATORY_CARE_PROVIDER_SITE_OTHER): Payer: Self-pay | Admitting: Family Medicine

## 2016-09-03 VITALS — BP 124/66 | HR 60 | Temp 98.2°F | Resp 14 | Ht 65.0 in | Wt 161.4 lb

## 2016-09-03 DIAGNOSIS — R053 Chronic cough: Secondary | ICD-10-CM

## 2016-09-03 DIAGNOSIS — E039 Hypothyroidism, unspecified: Secondary | ICD-10-CM

## 2016-09-03 DIAGNOSIS — R946 Abnormal results of thyroid function studies: Secondary | ICD-10-CM

## 2016-09-03 DIAGNOSIS — I10 Essential (primary) hypertension: Secondary | ICD-10-CM

## 2016-09-03 DIAGNOSIS — M25552 Pain in left hip: Secondary | ICD-10-CM

## 2016-09-03 DIAGNOSIS — R05 Cough: Secondary | ICD-10-CM

## 2016-09-03 DIAGNOSIS — R21 Rash and other nonspecific skin eruption: Secondary | ICD-10-CM

## 2016-09-03 MED ORDER — HYDROCHLOROTHIAZIDE 25 MG PO TABS: 25.0000 mg | ORAL_TABLET | Freq: Every day | ORAL | 1 refills | Status: DC

## 2016-09-03 MED ORDER — TRAMADOL HCL 50 MG PO TABS: 50.0000 mg | ORAL_TABLET | Freq: Three times a day (TID) | ORAL | 0 refills | Status: DC | PRN

## 2016-09-03 MED ORDER — LEVOTHYROXINE SODIUM 75 MCG PO TABS: 75.0000 ug | ORAL_TABLET | Freq: Every day | ORAL | 0 refills | Status: DC

## 2016-09-03 MED FILL — HYDROCHLOROTHIAZIDE 25 MG T: 25 | 30 days supply | Qty: 30 | Fill #0

## 2016-09-03 NOTE — Progress Notes (Signed)
Patient ID: Maria BarreMichelle Alcantar, female    DOB: Sep 25, 1960, 56 y.o.   MRN: [id number]  PCP: Bing NeighborsHarris, Gilman Olazabal S, FNP  Chief Complaint  Patient presents with  . Follow-up    6 MONTH   . Hip Pain    Subjective:  HPI Maria BarreMichelle Boyle is a 56 y.o. female presents for routine six month follow-up of chronic illness. Medical problems include: Chronic left hip pain, hx right hip replacement, hypothyroidism, hypertension, and current everyday tobacco use. Maria DusterMichelle suffers from hypertension and is currently prescribed HCTZ. Blood pressures have remained stable. Maria JesterMichele makes efforts to adhere to low sodium diet. She continues to smoke cigarettes daily. Maria DusterMichelle suffers from chronic left hip pain secondary to osteoarthritis which limits her ability to engage in physical exercise. Maria DusterMichelle is uninsured and due to financial constraints, she has not followed up with an Investment banker, operationalorthopedic surgeon. Maria DusterMichelle also reports that she has suffered from a recent cough, nonproductive, over several weeks. She thinks that her apartment building has mold. She has npt attempted relief with any antihistamines or over the counter antitussive medications. Denies associated shortness of breath, wheezing, or chest tightness. Maria DusterMichelle was diagnosed with scabies around 2-3 months prior. She reports that the entire building where she resides had to be treated due to an exposure. She is concern as she continues to have a rash on her lower bilateral legs. The rash is non pustular, dry, and occasionally itches. She reports treatment with Permethrin cream, twice and other lesions resolved with treatment. Social History   Social History  . Marital status: Single    Spouse name: N/A  . Number of children: N/A  . Years of education: N/A   Occupational History  . Not on file.   Social History Main Topics  . Smoking status: Current Every Day Smoker    Types: Cigarettes  . Smokeless tobacco: Never Used  . Alcohol use No  . Drug use: No  .  Sexual activity: Not on file   Other Topics Concern  . Not on file   Social History Narrative  . No narrative on file   Family History  Problem Relation Age of Onset  . Family history unknown: Yes    Review of Systems See HPI  Patient Active Problem List   Diagnosis Date Noted  . Hypothyroidism 05/24/2016  . Essential hypertension 09/24/2015    Allergies  Allergen Reactions  . Penicillins Rash    Prior to Admission medications   Medication Sig Start Date End Date Taking? Authorizing Provider  acetaminophen (TYLENOL) 500 MG tablet Take 2 tablets (1,000 mg total) by mouth every 6 (six) hours as needed. 05/22/16  Yes Bing NeighborsHarris, Dade Rodin S, FNP  clotrimazole-betamethasone (LOTRISONE) cream Apply 1 application topically 2 (two) times daily. For skin rash. 05/22/16  Yes Bing NeighborsHarris, Birl Lobello S, FNP  cyclobenzaprine (FLEXERIL) 10 MG tablet Take 1 tablet (10 mg total) by mouth 3 (three) times daily as needed for muscle spasms. 05/22/16  Yes Bing NeighborsHarris, Kaan Tosh S, FNP  hydrochlorothiazide (HYDRODIURIL) 25 MG tablet Take 1 tablet (25 mg total) by mouth daily. 06/22/16  Yes Bing NeighborsHarris, Haivyn Oravec S, FNP  levothyroxine (SYNTHROID, LEVOTHROID) 75 MCG tablet Take 1 tablet (75 mcg total) by mouth daily. 05/24/16  Yes Bing NeighborsHarris, Keigen Caddell S, FNP  meloxicam (MOBIC) 15 MG tablet Take 1 tablet (15 mg total) by mouth daily. 05/22/16  Yes Bing NeighborsHarris, Benay Pomeroy S, FNP  naproxen sodium (ANAPROX) 220 MG tablet Take 220 mg by mouth 2 (two) times daily as needed (pain).   Yes [provider]    Past Medical, Surgical Family and Social History reviewed and updated.    Objective:   Today's Vitals   09/03/16 0947  BP: 124/66  Pulse: 60  Resp: 14  Temp: 98.2 F (36.8 C)  TempSrc: Oral  SpO2: 100%  Weight: 161 lb 6.4 oz (73.2 kg)  Height: 5\' 5"  (1.651 m)    Wt Readings from Last 3 Encounters:  09/03/16 161 lb 6.4 oz (73.2 kg)  05/22/16 160 lb (72.6 kg)  09/24/15 161 lb (73 kg)   Physical Exam  Constitutional:  She is oriented to person, place, and time. She appears well-developed and well-nourished.  HENT:  Head: Normocephalic and atraumatic.  Eyes: Pupils are equal, round, and reactive to light. Conjunctivae and EOM are normal.  Neck: Normal range of motion. Neck supple. No thyromegaly present.  Cardiovascular: Normal rate and regular rhythm.   Pulmonary/Chest: Effort normal and breath sounds normal.  Musculoskeletal: She exhibits no edema or deformity.       Left hip: She exhibits decreased strength and tenderness.  Neurological: She is alert and oriented to person, place, and time.  Skin: Skin is warm and dry.  Psychiatric: She has a normal mood and affect. Her behavior is normal. Judgment and thought content normal.   Assessment & Plan:  1. Hypothyroidism, unspecified type, previously TSH was below normal. Levothyroxine dose was reduced. Will repeat thyroid panel today. For now, continue current dose of levothyroxine. - Thyroid Panel With TSH  2. Chronic cough, - DG Chest 2 View; Future -recommended OTC antihistamine for  post nasal drip and or antitussive OTC  -encouraged smoking cessation  3. Hypercalcemia,  - PTH, Intact and Calcium   4. Essential hypertension, controlled -Continue Hydrochlorothiazide.  5. Rash, likely dermatitis  - Apply Lotrisone cream, (previously prescribed)  twice daily to affected area.  6. Left hip pain  -Will trial Tramadol 50 mg every 6 hours as needed for moderate pain. -Patient was given [facility] patient assistance application to complete in order to receive an orthopedic surgery referral.  RTC: 6 weeks to follow up hip pain, rash, and cough.  Godfrey PickKimberly S. Tiburcio PeaHarris, MSN, FNP-C [facility]  [address] [place], [place] [id number] [phone]

## 2016-09-03 NOTE — Patient Instructions (Signed)
Complete the [facility] Patient Assistance Application. I am referring you to orthopedic surgery for evaluation of left hip pain.  [facility] today to obtain chest x-ray for evaluation of cough. You will be contact regarding image and notified of recommended therapy after I review image.   For hip pain, I am prescribing Tramadol 50 mg every 8 hours as needed.    Avascular Necrosis-Left HIP  Avascular necrosis is a disease resulting from the temporary or permanent loss of blood supply to a bone. This disease may also be known as:  Osteonecrosis.  Aseptic necrosis.  Ischemic bone necrosis.  Without proper blood supply, the internal layer of the affected bone dies and the outer layer of the bone may break down. If this process affects a bone near a joint, it may lead to collapse of that joint. Common bones that are affected by this condition include:  The top of your thigh bone (femoral head).  One or more bones in your wrist (scaphoid orlunate).  One or more bones in your foot (metatarsals).  One of the bones in your ankle (navicular).  The joint most commonly affected by this condition is the hip joint. Avascular necrosis rarely occurs in more than one bone at a time. What are the causes?  Damage or injury to a bone or joint.  Using corticosteroid medicine for a long period of time.  Changes in your immune or hormone systems.  Excessive exposure to radiation. What increases the risk?  Alcohol abuse.  Previous traumatic injury to a joint.  Using corticosteroid medicines for a long period of time or often.  Having a medical condition such as: ? HIV or AIDS. ? Diabetes. ? Sickle cell disease. ? An autoimmune disease. What are the signs or symptoms? The main symptoms of avascular necrosis are pain and decreased motion in the affected bone or joint. In the early stages the pain may be minor and occur only with activity. As avascular  necrosis progresses, pain may gradually worsen and occur while at rest. The pain may suddenly become severe if an affected joint collapses. How is this diagnosed? Avascular necrosis may be diagnosed with:  A medical history.  A physical exam.  X-rays.  An MRI.  A bone scan.  How is this treated? Treatments may include:  Medicine to help relieve pain.  Avoiding placing any pressure or weight ontheaffected area. If avascular necrosis occurs in your hip, ankle, or foot, you may be instructed to use crutches or a rolling scooter.  Surgery, such as: ? Core decompression. In this surgery, one or more holes are placed in the bone for new blood vessels to grow into. This provides a renewed blood supply to the bone. Core decompression can often reduce pain and pressure in the affected bone and slow the progression of bone and joint destruction. ? Osteotomy. In this surgery, the bone is reshaped to reduce stress on the affected area of the joint. ? Bone grafting. In this surgery, healthy bone from one part of your body is transplanted to the affected area. ? Arthroplasty. Arthroplasty is also known as total joint replacement. In this surgery, the affected surface on one or both sides of a joint is replaced with artificial parts (prostheses).  Electrical stimulation. This may help encourage new bone growth.  Follow these instructions at home:  Take medicines only as directed by your health care provider.  Follow your health care provider's recommendations on limiting activities or using crutches  to rest your affected joint.  Meet with aphysical therapist as directed by your health care provider.  Keep all follow-up visits as directed by your health care provider. This is important. Contact a health care provider if:  Your pain worsens.  You have decreased motion in your affected joint. Get help right away if: Your pain suddenly becomes severe. This information is not intended to  replace advice given to you by your health care provider. Make sure you discuss any questions you have with your health care provider. Document Released: 07/11/2001 Document Revised: 06/27/2015 Document Reviewed: 03/29/2013 Elsevier Interactive Patient Education  2018 Elsevier Inc.    Cough, Adult A cough helps to clear your throat and lungs. A cough may last only 2-3 weeks (acute), or it may last longer than 8 weeks (chronic). Many different things can cause a cough. A cough may be a sign of an illness or another medical condition. Follow these instructions at home:  Pay attention to any changes in your cough.  Take medicines only as told by your doctor. ? If you were prescribed an antibiotic medicine, take it as told by your doctor. Do not stop taking it even if you start to feel better. ? Talk with your doctor before you try using a cough medicine.  Drink enough fluid to keep your pee (urine) clear or pale yellow.  If the air is dry, use a cold steam vaporizer or humidifier in your home.  Stay away from things that make you cough at work or at home.  If your cough is worse at night, try using extra pillows to raise your head up higher while you sleep.  Do not smoke, and try not to be around smoke. If you need help quitting, ask your doctor.  Do not have caffeine.  Do not drink alcohol.  Rest as needed. Contact a doctor if:  You have new problems (symptoms).  You cough up yellow fluid (pus).  Your cough does not get better after 2-3 weeks, or your cough gets worse.  Medicine does not help your cough and you are not sleeping well.  You have pain that gets worse or pain that is not helped with medicine.  You have a fever.  You are losing weight and you do not know why.  You have night sweats. Get help right away if:  You cough up blood.  You have trouble breathing.  Your heartbeat is very fast. This information is not intended to replace advice given to you by  your health care provider. Make sure you discuss any questions you have with your health care provider. Document Released: 10/02/2010 Document Revised: 06/27/2015 Document Reviewed: 03/28/2014 Elsevier Interactive Patient Education  [employer]2018 Elsevier Inc.

## 2016-09-03 NOTE — Telephone Encounter (Signed)
Medication refilled

## 2016-09-04 LAB — PTH, INTACT AND CALCIUM
CALCIUM: 11.1 mg/dL — AB (ref 8.6–10.4)
PTH: 42 pg/mL (ref 14–64)

## 2016-09-04 LAB — THYROID PANEL WITH TSH
FREE THYROXINE INDEX: 3.1 (ref 1.4–3.8)
T3 Uptake: 30 % (ref 22–35)
T4, Total: 10.3 ug/dL (ref 4.5–12.0)
TSH: 1.33 mIU/L

## 2016-09-07 LAB — POCT URINALYSIS DIP (DEVICE)
Bilirubin Urine: NEGATIVE
Glucose, UA: NEGATIVE mg/dL
HGB URINE DIPSTICK: NEGATIVE
Ketones, ur: NEGATIVE mg/dL
LEUKOCYTES UA: NEGATIVE
NITRITE: NEGATIVE
PROTEIN: NEGATIVE mg/dL
SPECIFIC GRAVITY, URINE: 1.025 (ref 1.005–1.030)
UROBILINOGEN UA: 2 mg/dL — AB (ref 0.0–1.0)
pH: 6.5 (ref 5.0–8.0)

## 2016-09-09 ENCOUNTER — Encounter: Payer: Self-pay | Admitting: Family Medicine

## 2016-09-22 MED FILL — LEVOTHYROXINE 75 MCG TABLET: 75 | 30 days supply | Qty: 30 | Fill #0

## 2016-10-09 ENCOUNTER — Ambulatory Visit: Payer: Self-pay | Admitting: Family Medicine

## 2016-10-22 MED FILL — HYDROCHLOROTHIAZIDE 25 MG T: 25 | 30 days supply | Qty: 30 | Fill #1

## 2016-10-22 MED FILL — LEVOTHYROXINE 75 MCG TABLET: 75 | 30 days supply | Qty: 30 | Fill #1

## 2016-11-26 MED FILL — HYDROCHLOROTHIAZIDE 25 MG T: 25 | 30 days supply | Qty: 30 | Fill #2

## 2016-11-26 MED FILL — LEVOTHYROXINE 75 MCG TABLET: 75 | 30 days supply | Qty: 30 | Fill #2

## 2016-11-27 ENCOUNTER — Ambulatory Visit (INDEPENDENT_AMBULATORY_CARE_PROVIDER_SITE_OTHER): Payer: Self-pay | Admitting: Family Medicine

## 2016-11-27 ENCOUNTER — Encounter: Payer: Self-pay | Admitting: Family Medicine

## 2016-11-27 VITALS — BP 137/78 | HR 66 | Temp 97.8°F | Resp 14 | Ht 65.0 in | Wt 150.6 lb

## 2016-11-27 DIAGNOSIS — M25552 Pain in left hip: Secondary | ICD-10-CM

## 2016-11-27 DIAGNOSIS — I10 Essential (primary) hypertension: Secondary | ICD-10-CM

## 2016-11-27 DIAGNOSIS — F439 Reaction to severe stress, unspecified: Secondary | ICD-10-CM

## 2016-11-27 MED ORDER — HYDROCHLOROTHIAZIDE 25 MG PO TABS: 25.0000 mg | ORAL_TABLET | Freq: Every day | ORAL | 2 refills | Status: AC

## 2016-11-27 MED ORDER — GABAPENTIN 300 MG PO CAPS: 600.0000 mg | ORAL_CAPSULE | Freq: Every day | ORAL | 3 refills | Status: DC

## 2016-11-27 NOTE — Progress Notes (Signed)
Patient ID: Maria Boyle, female    DOB: 1960/04/02, 56 y.o.   MRN: [id number]  PCP: Bing Neighbors, FNP  Chief Complaint  Patient presents with  . Follow-up    Hip pain, Rash    Subjective:  HPI Maria Boyle is a 56 y.o. female presents for evaluation of chronic hip pain and rash.Chronic left hip pain, hx right hip replacement, hypothyroidism, hypertension, and current everyday tobacco use. Maria Boyle suffers from chronic left hip pain secondary to osteoarthritis which limits her ability to engage in physical exercise. Maria Boyle is uninsured and due to financial constraints, she has not followed up with an Investment banker, operational. She has been provided the [facility] application on multiple occasions and has not completed information. She report no medication previously prescribed is providing her any relief of pain. 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 This causes her increased anxiety. Maria Boyle suffers from hypertension and doesn't routinely monitor her blood pressure. She is adherent with medications.  Social History   Social History  . Marital status: Single    Spouse name: N/A  . Number of children: N/A  . Years of education: N/A   Occupational History  . Not on file.   Social History Main Topics  . Smoking status: Current Every Day Smoker    Types: Cigarettes  . Smokeless tobacco: Never Used  . Alcohol use No  . Drug use: No  . Sexual activity: Not on file   Other Topics Concern  . Not on file   Social History Narrative  . No narrative on file    Family History  Problem Relation Age of Onset  . Family history unknown: Yes   Review of Systems  See HPI   Patient Active Problem List   Diagnosis Date Noted  .  Hypothyroidism 05/24/2016  . Essential hypertension 09/24/2015    Allergies  Allergen Reactions  . Penicillins Rash    Prior to Admission medications   Medication Sig Start Date End Date Taking? Authorizing Provider  acetaminophen (TYLENOL) 500 MG tablet Take 2 tablets (1,000 mg total) by mouth every 6 (six) hours as needed. 05/22/16  Yes Bing Neighbors, FNP  clotrimazole-betamethasone (LOTRISONE) cream Apply 1 application topically 2 (two) times daily. For skin rash. 05/22/16  Yes Bing Neighbors, FNP  cyclobenzaprine (FLEXERIL) 10 MG tablet Take 1 tablet (10 mg total) by mouth 3 (three) times daily as needed for muscle spasms. 05/22/16  Yes Bing Neighbors, FNP  hydrochlorothiazide (HYDRODIURIL) 25 MG tablet Take 1 tablet (25 mg total) by mouth daily. 09/03/16  Yes Bing Neighbors, FNP  levothyroxine (SYNTHROID, LEVOTHROID) 75 MCG tablet Take 1 tablet (75 mcg total) by mouth daily. 09/03/16  Yes Bing Neighbors, FNP  meloxicam (MOBIC) 15 MG tablet Take 1 tablet (15 mg total) by mouth daily. 05/22/16  Yes Bing Neighbors, FNP  traMADol (ULTRAM) 50 MG tablet Take 1 tablet (50 mg total) by mouth every 8 (eight) hours as needed. 09/03/16  Yes Bing Neighbors, FNP  naproxen sodium (ANAPROX) 220 MG tablet Take 220 mg by mouth 2 (two) times daily as needed (pain).    [provider]    Past Medical, Surgical Family and Social History reviewed and updated.  Objective:   Today's Vitals   11/27/16 0935  BP: 137/78  Pulse: 66  Resp: 14  Temp: 97.8 F (36.6 C)  TempSrc: Oral  SpO2: 100%  Weight: 150 lb 9.6 oz (68.3 kg)  Height: 5\' 5"  (1.651 m)    Wt Readings from Last 3 Encounters:  11/27/16 150 lb 9.6 oz (68.3 kg)  09/03/16 161 lb 6.4 oz (73.2 kg)  05/22/16 160 lb (72.6 kg)   Physical Exam Physical Exam: Constitutional: Patient appears well-developed and well-nourished. No distress. HENT: Normocephalic, atraumatic, External right and left ear normal.  Oropharynx is clear and moist.  Eyes: Conjunctivae and EOM are normal. PERRLA, no scleral icterus. CVS: RRR, S1/S2 +, no murmurs, no gallops, no carotid bruit.  Pulmonary: Effort and breath sounds normal, no stridor, rhonchi, wheezes, rales.  Musculoskeletal: Normal range of motion. Gait Antalgic,Postive tenderness, No edema.  Lymphadenopathy: No lymphadenopathy noted, cervical, inguinal or axillary Neuro: Alert. Normal reflexes, muscle tone coordination. No cranial nerve deficit. Skin: Skin is warm and dry. No rash noted. Not diaphoretic. No erythema. No pallor. Psychiatric: Anxious mood and affect. Behavior, judgment, thought content normal. Assessment & Plan:  1. Essential hypertension, stable, at goal of less than 140/90. Discussed target BP range and blood pressure goal. Discussed the importance of compliance with medical therapy and DASH diet recommended, consequences of uncontrolled hypertension discussed, and the importance of physical activity as tolerated with a goal of 150 minutes per week.  - No changes in medication regimen today.   2. Stress, patient declined the need for medication. Discussed coping strategies, relaxation techniques, and distraction as methods of coping with stress.   3. Pain of left hip joint, continue Gabapentin 300 mg, TID for hip pain. Patient was provided another [facility] Financial Assistance Form to complete in order to be referred to Orthopedic Specialist.   Return for care 6 months for fasting labs, hypertension management, and address overdue health maintenance.   Godfrey PickKimberly S. Tiburcio PeaHarris, MSN, FNP-C The Patient Care [facility]  [address] [place], [place] [address] [phone]

## 2016-11-27 NOTE — Patient Instructions (Signed)
Stress and Stress Management Stress is a normal reaction to life events. It is what you feel when life demands more than you are used to or more than you can handle. Some stress can be useful. For example, the stress reaction can help you catch the last bus of the day, study for a test, or meet a deadline at work. But stress that occurs too often or for too long can cause problems. It can affect your emotional health and interfere with relationships and normal daily activities. Too much stress can weaken your immune system and increase your risk for physical illness. If you already have a medical problem, stress can make it worse. What are the causes? All sorts of life events may cause stress. An event that causes stress for one person may not be stressful for another person. Major life events commonly cause stress. These may be positive or negative. Examples include losing your job, moving into a new home, getting married, having a baby, or losing a loved one. Less obvious life events may also cause stress, especially if they occur day after day or in combination. Examples include working long hours, driving in traffic, caring for children, being in debt, or being in a difficult relationship. What are the signs or symptoms? Stress may cause emotional symptoms including, the following:  Anxiety. This is feeling worried, afraid, on edge, overwhelmed, or out of control.  Anger. This is feeling irritated or impatient.  Depression. This is feeling sad, down, helpless, or guilty.  Difficulty focusing, remembering, or making decisions.  Stress may cause physical symptoms, including the following:  Aches and pains. These may affect your head, neck, back, stomach, or other areas of your body.  Tight muscles or clenched jaw.  Low energy or trouble sleeping.  Stress may cause unhealthy behaviors, including the following:  Eating to feel better (overeating) or skipping meals.  Sleeping too little,  too much, or both.  Working too much or putting off tasks (procrastination).  Smoking, drinking alcohol, or using drugs to feel better.  How is this diagnosed? Stress is diagnosed through an assessment by your health care provider. Your health care provider will ask questions about your symptoms and any stressful life events.Your health care provider will also ask about your medical history and may order blood tests or other tests. Certain medical conditions and medicine can cause physical symptoms similar to stress. Mental illness can cause emotional symptoms and unhealthy behaviors similar to stress. Your health care provider may refer you to a mental health professional for further evaluation. How is this treated? Stress management is the recommended treatment for stress.The goals of stress management are reducing stressful life events and coping with stress in healthy ways. Techniques for reducing stressful life events include the following:  Stress identification. Self-monitor for stress and identify what causes stress for you. These skills may help you to avoid some stressful events.  Time management. Set your priorities, keep a calendar of events, and learn to say "no." These tools can help you avoid making too many commitments.  Techniques for coping with stress include the following:  Rethinking the problem. Try to think realistically about stressful events rather than ignoring them or overreacting. Try to find the positives in a stressful situation rather than focusing on the negatives.  Exercise. Physical exercise can release both physical and emotional tension. The key is to find a form of exercise you enjoy and do it regularly.  Relaxation techniques. These relax the body and  mind. Examples include yoga, meditation, tai chi, biofeedback, deep breathing, progressive muscle relaxation, listening to music, being out in nature, journaling, and other hobbies. Again, the key is to find  one or more that you enjoy and can do regularly.  Healthy lifestyle. Eat a balanced diet, get plenty of sleep, and do not smoke. Avoid using alcohol or drugs to relax.  Strong support network. Spend time with family, friends, or other people you enjoy being around.Express your feelings and talk things over with someone you trust.  Counseling or talktherapy with a mental health professional may be helpful if you are having difficulty managing stress on your own. Medicine is typically not recommended for the treatment of stress.Talk to your health care provider if you think you need medicine for symptoms of stress. Follow these instructions at home:  Keep all follow-up visits as directed by your health care provider.  Take all medicines as directed by your health care provider. Contact a health care provider if:  Your symptoms get worse or you start having new symptoms.  You feel overwhelmed by your problems and can no longer manage them on your own. Get help right away if:  You feel like hurting yourself or someone else. This information is not intended to replace advice given to you by your health care provider. Make sure you discuss any questions you have with your health care provider. Document Released: 07/15/2000 Document Revised: 06/27/2015 Document Reviewed: 09/13/2012 Elsevier Interactive Patient Education  2017 Elsevier Inc.  

## 2017-01-06 ENCOUNTER — Other Ambulatory Visit: Payer: Self-pay | Admitting: Family Medicine

## 2017-01-06 DIAGNOSIS — E039 Hypothyroidism, unspecified: Secondary | ICD-10-CM

## 2017-01-06 MED FILL — HYDROCHLOROTHIAZIDE 25 MG T: 25 | 30 days supply | Qty: 30 | Fill #0

## 2017-01-07 MED FILL — LEVOTHYROXINE 75 MCG TABLET: 75 | 30 days supply | Qty: 30 | Fill #0

## 2017-02-01 ENCOUNTER — Telehealth: Payer: Self-pay | Admitting: Family Medicine

## 2017-02-01 NOTE — Telephone Encounter (Signed)
FMLA paperwork completed. Please fax paperwork and include office notes.  Godfrey PickKimberly S. Tiburcio PeaHarris, MSN, FNP-C The Patient Care [facility]  [address] [place], [place] [address] [phone]

## 2017-02-03 NOTE — Telephone Encounter (Signed)
Paperwork faxed and copy left for patient to pickup at front desk

## 2017-05-26 ENCOUNTER — Encounter: Payer: Self-pay | Admitting: Family Medicine

## 2017-05-26 ENCOUNTER — Ambulatory Visit (INDEPENDENT_AMBULATORY_CARE_PROVIDER_SITE_OTHER): Payer: Self-pay | Admitting: Family Medicine

## 2017-05-26 VITALS — BP 132/78 | HR 60 | Temp 98.0°F | Resp 14 | Ht 65.0 in | Wt 132.0 lb

## 2017-05-26 DIAGNOSIS — M87052 Idiopathic aseptic necrosis of left femur: Secondary | ICD-10-CM

## 2017-05-26 DIAGNOSIS — E039 Hypothyroidism, unspecified: Secondary | ICD-10-CM

## 2017-05-26 DIAGNOSIS — I10 Essential (primary) hypertension: Secondary | ICD-10-CM

## 2017-05-26 DIAGNOSIS — M25552 Pain in left hip: Secondary | ICD-10-CM

## 2017-05-26 DIAGNOSIS — Z131 Encounter for screening for diabetes mellitus: Secondary | ICD-10-CM

## 2017-05-26 DIAGNOSIS — G8929 Other chronic pain: Secondary | ICD-10-CM

## 2017-05-26 LAB — POCT URINALYSIS DIP (MANUAL ENTRY)
BILIRUBIN UA: NEGATIVE
GLUCOSE UA: NEGATIVE mg/dL
Leukocytes, UA: NEGATIVE
NITRITE UA: NEGATIVE
Protein Ur, POC: NEGATIVE mg/dL
Spec Grav, UA: >=1.03 — AB (ref 1.010–1.025)
Urobilinogen, UA: 1 E.U./dL
pH, UA: 5.5 (ref 5.0–8.0)

## 2017-05-26 LAB — POCT GLYCOSYLATED HEMOGLOBIN (HGB A1C): Hemoglobin A1C: 4.9

## 2017-05-26 MED ORDER — GABAPENTIN 300 MG PO CAPS: 600.0000 mg | ORAL_CAPSULE | Freq: Every day | ORAL | 3 refills | Status: AC

## 2017-05-26 NOTE — Progress Notes (Signed)
poct

## 2017-05-26 NOTE — Patient Instructions (Signed)
I have sent a referral to orthopedic services for further workup and evaluation of a vascular necrosis of left hip.   since you are unable to perform wound, have discussed being out of work starting on Jun 02, 2017.  I recommend that you continue to ambulate with her cane.  Also, continue gabapentin 600 mg at bedtime.  Also continue all other medications as previously prescribed. Your blood pressure is at goal on current medication regimen, no changes warranted on today. We will follow-up by phone with any abnormal laboratory results.

## 2017-05-26 NOTE — Progress Notes (Signed)
Subjective:    Patient ID: Maria Boyle, female    DOB: Jul 02, 1960, 57 y.o.   MRN: [id number]  Maria Boyle, a 57 yea old female with a history of chronic left hip pain and acquired hypothyroidism presents for follow up. Maria Boyle has been lost to follow up over the past 6 months. She is status post right hip placement. She now complains of left hip pain secondary to osteoarthritis. Previous imaging confirmed avascular necrosis of left hip. She says that left hip pain is interfering with ability to to perform ADLs. 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 She has not been evaluated by orthopedic services due to financial constraints. Pain is aggravated by applying weight, going up and down stairs, prolonged sitting, standing, or laying down. She has attempted OTC medications without sustained relief. Current pain intensity is 10/10 characterized as constant and throbbing. Maria Boyle is also requesting a letter for work due to inability to perform job duties.   Patient also has a history of hypothyroidism. She has been out of medications over the past several weeks. She denies fatigue, constipation, heart palpitations, weight loss, or weight gain.   Past Medical History:  Diagnosis Date  . Hypertension   . Thyroid disease    Social History   Socioeconomic History  . Marital status: Single    Spouse name: Not on file  . Number of children: Not on file  . Years of education: Not on file  . Highest education level: Not on file  Occupational History  . Not on file  Social Needs  . Financial resource strain: Not on file  . Food insecurity:    Worry: Not on file    Inability: Not on file  . Transportation needs:    Medical: Not on file    Non-medical: Not on file  Tobacco Use  . Smoking status: Current Every Day Smoker    Types: Cigarettes  . Smokeless tobacco: Never Used  Substance and Sexual Activity  . Alcohol use:  No  . Drug use: No  . Sexual activity: Not on file  Lifestyle  . Physical activity:    Days per week: Not on file    Minutes per session: Not on file  . Stress: Not on file  Relationships  . Social connections:    Talks on phone: Not on file    Gets together: Not on file    Attends religious service: Not on file    Active member of club or organization: Not on file    Attends meetings of clubs or organizations: Not on file    Relationship status: Not on file  . Intimate partner violence:    Fear of current or ex partner: Not on file    Emotionally abused: Not on file    Physically abused: Not on file    Forced sexual activity: Not on file  Other Topics Concern  . Not on file  Social History Narrative  . Not on file   There is no immunization history on file for this patient.   Review of Systems  Constitutional: Negative.   Eyes: Negative.   Respiratory: Negative.   Cardiovascular: Negative.   Gastrointestinal: Negative.   Endocrine: Negative for polydipsia, polyphagia and polyuria.  Musculoskeletal: Positive for arthralgias.       Left hip pain  Hematological: Negative.   Psychiatric/Behavioral: Negative.        Objective:   Physical Exam  Cardiovascular: Normal rate, regular rhythm, normal heart sounds and intact distal pulses.  Pulmonary/Chest: Effort normal and breath sounds normal.  Abdominal: Soft. Bowel sounds are normal.  Musculoskeletal:       Left hip: She exhibits decreased range of motion and decreased strength.  Limited exam, guarding due to intense pain      BP 132/78 (BP Location: Left Arm, Patient Position: Sitting, Cuff Size: Small)   Pulse 60   Temp 98 F (36.7 C) (Oral)   Resp 14   Ht 5\' 5"  (1.651 m)   Wt 132 lb (59.9 kg)   SpO2 100%   BMI 21.97 kg/m     Assessment & Plan:  1. Chronic hip pain, left Patient advised to complete financial assistance forms for payer source. She warrants referral to orthopedic services for further work  up and evaluation.  - AMB referral to orthopedics - gabapentin (NEURONTIN) 300 MG capsule; Take 2 capsules (600 mg total) by mouth at bedtime.  Dispense: 90 capsule; Refill: 3  2. Avascular necrosis of bone of left hip (HCC) - AMB referral to orthopedics - gabapentin (NEURONTIN) 300 MG capsule; Take 2 capsules (600 mg total) by mouth at bedtime.  Dispense: 90 capsule; Refill: 3  3. Screening for diabetes mellitus - POCT glycosylated hemoglobin (Hb A1C) - POCT urinalysis dipstick  4. Essential hypertension We have discussed target BP range and blood pressure goal. I have advised patient to check BP regularly and to call us back or report to clinic if the numbers are consistently higher than 140/90. We discussed the importance of compliance with medical therapy and DASH diet recommended, consequences of uncontrolled hypertension discussed.  - continue current BP medications - Basic Metabolic Panel  5. Acquired hypothyroidism - Thyroid Panel With TSH   RTC: 3 months for chronic conditions  Nolon NationsLachina Moore Hollis  MSN, FNP-C Patient Care [facility] [address]  [place], [place] [address] [phone]

## 2017-05-27 ENCOUNTER — Other Ambulatory Visit: Payer: Self-pay | Admitting: Family Medicine

## 2017-05-27 ENCOUNTER — Telehealth: Payer: Self-pay

## 2017-05-27 DIAGNOSIS — E039 Hypothyroidism, unspecified: Secondary | ICD-10-CM

## 2017-05-27 LAB — BASIC METABOLIC PANEL
BUN/Creatinine Ratio: 9 (ref 9–23)
BUN: 7 mg/dL (ref 6–24)
CALCIUM: 10.8 mg/dL — AB (ref 8.7–10.2)
CO2: 25 mmol/L (ref 20–29)
CREATININE: 0.76 mg/dL (ref 0.57–1.00)
Chloride: 105 mmol/L (ref 96–106)
GFR, EST AFRICAN AMERICAN: 101 mL/min/{1.73_m2} (ref >59)
GFR, EST NON AFRICAN AMERICAN: 88 mL/min/{1.73_m2} (ref >59)
Glucose: 74 mg/dL (ref 65–99)
Potassium: 4 mmol/L (ref 3.5–5.2)
Sodium: 142 mmol/L (ref 134–144)

## 2017-05-27 LAB — THYROID PANEL WITH TSH
FREE THYROXINE INDEX: 1.9 (ref 1.2–4.9)
T3 UPTAKE RATIO: 29 % (ref 24–39)
T4, Total: 6.5 ug/dL (ref 4.5–12.0)
TSH: 13.65 u[IU]/mL — ABNORMAL HIGH (ref 0.450–4.500)

## 2017-05-27 MED ORDER — LEVOTHYROXINE SODIUM 75 MCG PO TABS: 75.0000 ug | ORAL_TABLET | Freq: Every day | ORAL | 0 refills | Status: AC

## 2017-05-27 NOTE — Telephone Encounter (Signed)
Patient states that she has been out her medication for 2 weeks and just picked it up yesterday.

## 2017-05-27 NOTE — Progress Notes (Unsigned)
Meds ordered this encounter  Medications  . levothyroxine (SYNTHROID, LEVOTHROID) 75 MCG tablet    Sig: Take 1 tablet (75 mcg total) by mouth daily.    Dispense:  90 tablet    Refill:  0        Nolon NationsLachina Moore Ambri Miltner  MSN, FNP-C Patient [facility] [address]  [place], [place] [address] [phone]

## 2017-05-27 NOTE — Telephone Encounter (Signed)
-----   Message from Massie MaroonLachina M Hollis, [place]NP sent at 05/27/2017 12:27 PM EDT ----- Regarding: inquiry? TSH is elevated. Please inquire whether patient was taking medication consistently prior to appt on 05/26/2017.   Nolon NationsLachina Moore Hollis  MSN, FNP-C Patient Care [facility] [address]  [place], [place] [id number] [phone]

## 2017-05-28 NOTE — Progress Notes (Signed)
Left a vm for patient to callback 

## 2017-06-01 NOTE — Progress Notes (Signed)
Call patient again no answer

## 2017-06-02 NOTE — Progress Notes (Signed)
Left a vm for patient to callback to set up a lab appoinment

## 2017-06-03 ENCOUNTER — Ambulatory Visit (INDEPENDENT_AMBULATORY_CARE_PROVIDER_SITE_OTHER): Payer: Self-pay | Admitting: Orthopaedic Surgery

## 2017-06-15 ENCOUNTER — Encounter (INDEPENDENT_AMBULATORY_CARE_PROVIDER_SITE_OTHER): Payer: Self-pay | Admitting: Orthopaedic Surgery

## 2017-06-15 ENCOUNTER — Ambulatory Visit (INDEPENDENT_AMBULATORY_CARE_PROVIDER_SITE_OTHER): Payer: Self-pay

## 2017-06-15 ENCOUNTER — Ambulatory Visit (INDEPENDENT_AMBULATORY_CARE_PROVIDER_SITE_OTHER): Payer: Self-pay | Admitting: Orthopaedic Surgery

## 2017-06-15 DIAGNOSIS — M25552 Pain in left hip: Secondary | ICD-10-CM

## 2017-06-15 DIAGNOSIS — M1612 Unilateral primary osteoarthritis, left hip: Secondary | ICD-10-CM

## 2017-06-15 NOTE — Progress Notes (Signed)
Office Visit Note   Patient: Maria Boyle           Date of Birth: 03-08-1960           MRN: [id number] Visit Date: 06/15/2017              Requested by: Massie Maroon, FNP [address] [place], [place] [address] PCP: Bing Neighbors, FNP   Assessment & Plan: Visit Diagnoses:  1. Pain in left hip   2. Unilateral primary osteoarthritis, left hip     Plan: At this point there is no really other treatment options.  Her arthritis is so severe the joint space is so narrow that she can even get a steroid injection in her hip at this point and is definitely not warranted based on the severity of her hip disease.  I talked her about hip replacement surgery through direct anterior approach.  I showed her hip model and went over x-rays in detail.  We talked about the risk and benefits of the surgery and what her intraoperative and postoperative course involved.  All questions and concerns were answered and addressed.  We will work surgery set up for soon as possible given the severity of her disease  Follow-Up Instructions: Return for 2 weeks post-op.   Orders:  Orders Placed This Encounter  Procedures  . XR HIP UNILAT W OR W/O PELVIS 1V LEFT   No orders of the defined types were placed in this encounter.     Procedures: No procedures performed   Clinical Data: No additional findings.   Subjective: Chief Complaint  Patient presents with  . Left Hip - Pain  Patient is a very thin 57 year old female with debilitating left hip pain and known well-documented osteoarthritis is been going on for well over a year now.  She actually has had a hip replacement 10 years ago on the right hip and that still causes her no issues at all.  She is work on activity modification and does ambulate with a cane.  Her pain is daily and it is severe.  It is 10 out of 10 at times.  It is detrimental effect directives daily living, quality of life, mobility.  She does work as a Cabin crew.  She is not a diabetic.  He does have a history of thyroid disease.  At this point she does wish to proceed with a total hip arthroplasty on the left side and I agree with this based on the clinical exam seen below as well as the severity of her x-rays that have been seen now for well over a year.  We did get new x-rays today and compared them from a year ago as well.  HPI  Review of Systems She is alert and oriented x3 and in no acute distress She denies any headache, chest pain, shortness of breath, fever, chills, nausea, vomiting. Objective: Vital Signs: There were no vitals taken for this visit.  Physical Exam She walks ambulating with a cane.  She has a significant Trendelenburg gait. Ortho Exam To the severity of her left hip pain is a slight flexion contracture.  She has severe pain with attempts of internal or external rotation of left hip.  Her left knee exam is normal and her right hip exam is normal. Specialty Comments:  No specialty comments available.  Imaging: Xr Hip Unilat W Or W/o Pelvis 1v Left  Result Date: 06/15/2017 An AP pelvis and lateral left hip shows  a total hip arthroplasty on the right side with no complicating features.  Severe end-stage arthritis of the left side.  This compared to films from a year ago which still show severe end-stage arthritis of left hip.  There are cystic changes in the femoral head and acetabulum.  There is complete loss of the joint space as well.  There is also sclerotic changes.    PMFS History: Patient Active Problem List   Diagnosis Date Noted  . Hypothyroidism 05/24/2016  . Essential hypertension 09/24/2015   Past Medical History:  Diagnosis Date  . Hypertension   . Thyroid disease     Family History  Problem Relation Age of Onset  . Hypercalcemia Mother   . Hypertension Mother   . Hypertension Father   . Cancer Paternal Grandmother   . Diabetes Other     No past surgical history on file. Social History     Occupational History  . Not on file  Tobacco Use  . Smoking status: Current Every Day Smoker    Types: Cigarettes  . Smokeless tobacco: Never Used  Substance and Sexual Activity  . Alcohol use: No  . Drug use: No  . Sexual activity: Not on file

## 2017-06-21 ENCOUNTER — Other Ambulatory Visit (INDEPENDENT_AMBULATORY_CARE_PROVIDER_SITE_OTHER): Payer: Self-pay | Admitting: Physician Assistant

## 2017-06-22 ENCOUNTER — Ambulatory Visit: Payer: Self-pay

## 2017-06-22 NOTE — Pre-Procedure Instructions (Signed)
Maria Boyle  06/22/2017      [facility] - [place], [place] - [address] [place] [address] Phone: [phone] Fax: [phone]    Your procedure is scheduled on 06/29/2017.  Report to [facility] Admitting at 1220 A.M.  Call this number if you have problems the morning of surgery:  [phone]   Remember:  Nothing to eat or drink after midnight the night before your surgery   Take these medicines the morning of surgery with A SIP OF WATER: Levothyroxine (Synthroid)  7 days prior to surgery STOP taking any Aspirin (unless otherwise instructed by your surgeon), Aleve, Naproxen, Ibuprofen, Motrin, Advil, Goody's, BC's, all herbal medications, fish oil, and all vitamins     Do not wear jewelry, make-up or nail polish.  Do not wear lotions, powders, or perfumes, or deodorant.  Do not shave 48 hours prior to surgery.    Do not bring valuables to the hospital.  [facility] is not responsible for any belongings or valuables.  Hearing aids, eyeglasses, contacts, dentures or bridgework may not be worn into surgery.  Leave your suitcase in the car.  After surgery it may be brought to your room.  For patients admitted to the hospital, discharge time will be determined by your treatment team.  Patients discharged the day of surgery will not be allowed to drive home.   Name and phone number of your driver:    Special instructions:   [facility]- Preparing For Surgery  Before surgery, you can play an important role. Because skin is not sterile, your skin needs to be as free of germs as possible. You can reduce the number of germs on your skin by washing with CHG (chlorahexidine gluconate) Soap before surgery.  CHG is an antiseptic cleaner which kills germs and bonds with the skin to continue killing germs even after washing.    Oral Hygiene is also important to reduce your risk of infection.  Remember - BRUSH YOUR TEETH THE  MORNING OF SURGERY WITH YOUR REGULAR TOOTHPASTE  Please do not use if you have an allergy to CHG or antibacterial soaps. If your skin becomes reddened/irritated stop using the CHG.  Do not shave (including legs and underarms) for at least 48 hours prior to first CHG shower. It is OK to shave your face.  Please follow these instructions carefully.   1. Shower the NIGHT BEFORE SURGERY and the MORNING OF SURGERY with CHG.   2. If you chose to wash your hair, wash your hair first as usual with your normal shampoo.  3. After you shampoo, rinse your hair and body thoroughly to remove the shampoo.  4. Use CHG as you would any other liquid soap. You can apply CHG directly to the skin and wash gently with a scrungie or a clean washcloth.   5. Apply the CHG Soap to your body ONLY FROM THE NECK DOWN.  Do not use on open wounds or open sores. Avoid contact with your eyes, ears, mouth and genitals (private parts). Wash Face and genitals (private parts)  with your normal soap.  6. Wash thoroughly, paying special attention to the area where your surgery will be performed.  7. Thoroughly rinse your body with warm water from the neck down.  8. DO NOT shower/wash with your normal soap after using and rinsing off the CHG Soap.  9. Pat yourself dry with a CLEAN TOWEL.  10. Wear CLEAN PAJAMAS  to bed the night before surgery, wear comfortable clothes the morning of surgery  11. Place CLEAN SHEETS on your bed the night of your first shower and DO NOT SLEEP WITH PETS.    Day of Surgery: Shower as stated above. Do not apply any deodorants/lotions.  Please wear clean clothes to the hospital/surgery center.   Remember to brush your teeth WITH YOUR REGULAR TOOTHPASTE.    Please read over the following fact sheets that you were given.

## 2017-06-23 ENCOUNTER — Inpatient Hospital Stay (HOSPITAL_COMMUNITY)
Admission: RE | Admit: 2017-06-23 | Discharge: 2017-06-23 | Disposition: A | Discharge status: Home / Self Care | Payer: Self-pay | Source: Ambulatory Visit

## 2017-06-25 ENCOUNTER — Encounter (HOSPITAL_COMMUNITY): Payer: Self-pay | Admitting: Urology

## 2017-06-25 ENCOUNTER — Encounter (HOSPITAL_COMMUNITY)
Admission: RE | Admit: 2017-06-25 | Discharge: 2017-06-25 | Disposition: A | Discharge status: Home / Self Care | Payer: Self-pay | Source: Ambulatory Visit | Attending: Surgery | Admitting: Surgery

## 2017-06-25 ENCOUNTER — Other Ambulatory Visit: Payer: Self-pay

## 2017-06-25 DIAGNOSIS — Z01812 Encounter for preprocedural laboratory examination: Secondary | ICD-10-CM | POA: Insufficient documentation

## 2017-06-25 DIAGNOSIS — I10 Essential (primary) hypertension: Secondary | ICD-10-CM | POA: Insufficient documentation

## 2017-06-25 DIAGNOSIS — R001 Bradycardia, unspecified: Secondary | ICD-10-CM | POA: Insufficient documentation

## 2017-06-25 DIAGNOSIS — Z01810 Encounter for preprocedural cardiovascular examination: Secondary | ICD-10-CM | POA: Insufficient documentation

## 2017-06-25 LAB — CBC
HEMATOCRIT: 43.5 % (ref 36.0–46.0)
Hemoglobin: 14 g/dL (ref 12.0–15.0)
MCH: 31.3 pg (ref 26.0–34.0)
MCHC: 32.2 g/dL (ref 30.0–36.0)
MCV: 97.1 fL (ref 78.0–100.0)
Platelets: 237 10*3/uL (ref 150–400)
RBC: 4.48 MIL/uL (ref 3.87–5.11)
RDW: 14.2 % (ref 11.5–15.5)
WBC: 5.6 10*3/uL (ref 4.0–10.5)

## 2017-06-25 LAB — BASIC METABOLIC PANEL
Anion gap: 6 (ref 5–15)
BUN: 8 mg/dL (ref 6–20)
CALCIUM: 10.9 mg/dL — AB (ref 8.9–10.3)
CO2: 27 mmol/L (ref 22–32)
CREATININE: 0.95 mg/dL (ref 0.44–1.00)
Chloride: 108 mmol/L (ref 101–111)
GFR calc non Af Amer: >60 mL/min (ref >60)
Glucose, Bld: 105 mg/dL — ABNORMAL HIGH (ref 65–99)
Potassium: 4 mmol/L (ref 3.5–5.1)
Sodium: 141 mmol/L (ref 135–145)

## 2017-06-25 NOTE — Progress Notes (Signed)
Pre-op call complete. Pt PCP at [facility]. Pt denies cardiac history or cardiac workup within the last 5 years. Pt reports she has had some normal tests in the past.

## 2017-06-29 ENCOUNTER — Inpatient Hospital Stay (HOSPITAL_COMMUNITY): Payer: Self-pay

## 2017-06-29 ENCOUNTER — Inpatient Hospital Stay (HOSPITAL_COMMUNITY)
Admission: RE | Admit: 2017-06-29 | Discharge: 2017-07-02 | DRG: 470 | Disposition: A | Discharge status: Home Health | Payer: Self-pay | Attending: Orthopaedic Surgery | Admitting: Orthopaedic Surgery

## 2017-06-29 ENCOUNTER — Encounter (HOSPITAL_COMMUNITY): Payer: Self-pay | Admitting: Certified Registered Nurse Anesthetist

## 2017-06-29 ENCOUNTER — Encounter (HOSPITAL_COMMUNITY)
Admission: RE | Disposition: A | Discharge status: Home Health | Payer: Self-pay | Source: Home / Self Care | Attending: Orthopaedic Surgery

## 2017-06-29 ENCOUNTER — Inpatient Hospital Stay (HOSPITAL_COMMUNITY): Payer: Self-pay | Admitting: Anesthesiology

## 2017-06-29 DIAGNOSIS — Z880 Allergy status to penicillin: Secondary | ICD-10-CM

## 2017-06-29 DIAGNOSIS — F17210 Nicotine dependence, cigarettes, uncomplicated: Secondary | ICD-10-CM | POA: Diagnosis present

## 2017-06-29 DIAGNOSIS — M1612 Unilateral primary osteoarthritis, left hip: Secondary | ICD-10-CM

## 2017-06-29 DIAGNOSIS — Z96642 Presence of left artificial hip joint: Secondary | ICD-10-CM

## 2017-06-29 DIAGNOSIS — Z7982 Long term (current) use of aspirin: Secondary | ICD-10-CM

## 2017-06-29 DIAGNOSIS — Z96641 Presence of right artificial hip joint: Secondary | ICD-10-CM | POA: Diagnosis present

## 2017-06-29 DIAGNOSIS — Z419 Encounter for procedure for purposes other than remedying health state, unspecified: Secondary | ICD-10-CM

## 2017-06-29 DIAGNOSIS — I10 Essential (primary) hypertension: Secondary | ICD-10-CM | POA: Diagnosis present

## 2017-06-29 DIAGNOSIS — Z79890 Hormone replacement therapy (postmenopausal): Secondary | ICD-10-CM

## 2017-06-29 DIAGNOSIS — E890 Postprocedural hypothyroidism: Secondary | ICD-10-CM | POA: Diagnosis present

## 2017-06-29 HISTORY — PX: TOTAL HIP ARTHROPLASTY: SHX124

## 2017-06-29 HISTORY — DX: Lactose intolerance, unspecified: E73.9

## 2017-06-29 SURGERY — ARTHROPLASTY, HIP, TOTAL, ANTERIOR APPROACH
Anesthesia: Spinal | Laterality: Left

## 2017-06-29 MED ORDER — SODIUM CHLORIDE 0.9 % IR SOLN
Status: DC | PRN
  Administered 2017-06-29: 3000 mL

## 2017-06-29 MED ORDER — PANTOPRAZOLE SODIUM 40 MG PO TBEC
40.0000 mg | DELAYED_RELEASE_TABLET | Freq: Every day | ORAL | Status: DC
  Administered 2017-06-29 – 2017-07-01 (×3): 40 mg via ORAL
  Filled 2017-06-29 (×3): qty 1

## 2017-06-29 MED ORDER — ALUM & MAG HYDROXIDE-SIMETH 200-200-20 MG/5ML PO SUSP: 30.0000 mL | ORAL | Status: DC | PRN

## 2017-06-29 MED ORDER — CLINDAMYCIN PHOSPHATE 600 MG/50ML IV SOLN
600.0000 mg | Freq: Four times a day (QID) | INTRAVENOUS | Status: AC
  Administered 2017-06-29 – 2017-06-30 (×2): 600 mg via INTRAVENOUS
  Filled 2017-06-29 (×3): qty 50

## 2017-06-29 MED ORDER — ASPIRIN 81 MG PO CHEW
81.0000 mg | CHEWABLE_TABLET | Freq: Two times a day (BID) | ORAL | Status: DC
  Administered 2017-06-29 – 2017-07-01 (×5): 81 mg via ORAL
  Filled 2017-06-29 (×5): qty 1

## 2017-06-29 MED ORDER — ZOLPIDEM TARTRATE 5 MG PO TABS: 5.0000 mg | ORAL_TABLET | Freq: Every evening | ORAL | Status: DC | PRN

## 2017-06-29 MED ORDER — HYDROCODONE-ACETAMINOPHEN 5-325 MG PO TABS
ORAL_TABLET | ORAL | Status: AC
  Filled 2017-06-29: qty 1

## 2017-06-29 MED ORDER — ACETAMINOPHEN 10 MG/ML IV SOLN
INTRAVENOUS | Status: AC
Start: 2017-06-29 — End: ?
  Filled 2017-06-29: qty 100

## 2017-06-29 MED ORDER — ACETAMINOPHEN 10 MG/ML IV SOLN
INTRAVENOUS | Status: AC
  Filled 2017-06-29: qty 100

## 2017-06-29 MED ORDER — ONDANSETRON HCL 4 MG/2ML IJ SOLN
INTRAMUSCULAR | Status: DC | PRN
  Administered 2017-06-29: 4 mg via INTRAVENOUS

## 2017-06-29 MED ORDER — HYDROMORPHONE HCL 2 MG/ML IJ SOLN
INTRAMUSCULAR | Status: AC
  Filled 2017-06-29: qty 1

## 2017-06-29 MED ORDER — DOCUSATE SODIUM 100 MG PO CAPS
100.0000 mg | ORAL_CAPSULE | Freq: Two times a day (BID) | ORAL | Status: DC
  Administered 2017-06-30 – 2017-07-01 (×4): 100 mg via ORAL
  Filled 2017-06-29 (×5): qty 1

## 2017-06-29 MED ORDER — FENTANYL CITRATE (PF) 250 MCG/5ML IJ SOLN
INTRAMUSCULAR | Status: AC
  Filled 2017-06-29: qty 5

## 2017-06-29 MED ORDER — EPHEDRINE SULFATE-NACL 50-0.9 MG/10ML-% IV SOSY
PREFILLED_SYRINGE | INTRAVENOUS | Status: DC | PRN
  Administered 2017-06-29 (×3): 5 mg via INTRAVENOUS

## 2017-06-29 MED ORDER — HYDROCHLOROTHIAZIDE 25 MG PO TABS
25.0000 mg | ORAL_TABLET | Freq: Every day | ORAL | Status: DC
  Administered 2017-06-29 – 2017-07-01 (×3): 25 mg via ORAL
  Filled 2017-06-29 (×3): qty 1

## 2017-06-29 MED ORDER — TRANEXAMIC ACID 1000 MG/10ML IV SOLN
1000.0000 mg | INTRAVENOUS | Status: AC
  Administered 2017-06-29: 1000 mg via INTRAVENOUS
  Filled 2017-06-29: qty 1100

## 2017-06-29 MED ORDER — LIDOCAINE 2% (20 MG/ML) 5 ML SYRINGE
INTRAMUSCULAR | Status: DC | PRN
  Administered 2017-06-29: 100 mg via INTRAVENOUS

## 2017-06-29 MED ORDER — SODIUM CHLORIDE 0.9 % IV SOLN
INTRAVENOUS | Status: DC
  Administered 2017-06-29: 18:00:00 via INTRAVENOUS

## 2017-06-29 MED ORDER — ACETAMINOPHEN 325 MG PO TABS: 325.0000 mg | ORAL_TABLET | Freq: Four times a day (QID) | ORAL | Status: DC | PRN

## 2017-06-29 MED ORDER — ROCURONIUM BROMIDE 10 MG/ML (PF) SYRINGE
PREFILLED_SYRINGE | INTRAVENOUS | Status: DC | PRN
  Administered 2017-06-29: 40 mg via INTRAVENOUS

## 2017-06-29 MED ORDER — ONDANSETRON HCL 4 MG/2ML IJ SOLN: 4.0000 mg | Freq: Four times a day (QID) | INTRAMUSCULAR | Status: DC | PRN

## 2017-06-29 MED ORDER — HYDROMORPHONE HCL 2 MG/ML IJ SOLN
0.3000 mg | INTRAMUSCULAR | Status: DC | PRN
  Administered 2017-06-29 (×4): 0.5 mg via INTRAVENOUS

## 2017-06-29 MED ORDER — PROMETHAZINE HCL 25 MG/ML IJ SOLN: 6.2500 mg | INTRAMUSCULAR | Status: DC | PRN

## 2017-06-29 MED ORDER — PROPOFOL 10 MG/ML IV BOLUS
INTRAVENOUS | Status: DC | PRN
  Administered 2017-06-29: 30 mg via INTRAVENOUS

## 2017-06-29 MED ORDER — LACTATED RINGERS IV SOLN
INTRAVENOUS | Status: DC
  Administered 2017-06-29 (×2): via INTRAVENOUS

## 2017-06-29 MED ORDER — METOCLOPRAMIDE HCL 5 MG/ML IJ SOLN: 5.0000 mg | Freq: Three times a day (TID) | INTRAMUSCULAR | Status: DC | PRN

## 2017-06-29 MED ORDER — SUGAMMADEX SODIUM 200 MG/2ML IV SOLN
INTRAVENOUS | Status: DC | PRN
  Administered 2017-06-29: 125 mg via INTRAVENOUS

## 2017-06-29 MED ORDER — METHOCARBAMOL 500 MG PO TABS
ORAL_TABLET | ORAL | Status: AC
  Administered 2017-06-29: 500 mg via ORAL
  Filled 2017-06-29: qty 1

## 2017-06-29 MED ORDER — DEXAMETHASONE SODIUM PHOSPHATE 10 MG/ML IJ SOLN
INTRAMUSCULAR | Status: DC | PRN
  Administered 2017-06-29: 10 mg via INTRAVENOUS

## 2017-06-29 MED ORDER — MEPERIDINE HCL 50 MG/ML IJ SOLN: 6.2500 mg | INTRAMUSCULAR | Status: DC | PRN

## 2017-06-29 MED ORDER — METHOCARBAMOL 500 MG PO TABS
500.0000 mg | ORAL_TABLET | Freq: Four times a day (QID) | ORAL | Status: DC | PRN
  Administered 2017-06-29 – 2017-07-02 (×7): 500 mg via ORAL
  Filled 2017-06-29 (×5): qty 1

## 2017-06-29 MED ORDER — LEVOTHYROXINE SODIUM 75 MCG PO TABS
75.0000 ug | ORAL_TABLET | Freq: Every day | ORAL | Status: DC
  Administered 2017-06-30 – 2017-07-02 (×3): 75 ug via ORAL
  Filled 2017-06-29 (×3): qty 1

## 2017-06-29 MED ORDER — MENTHOL 3 MG MT LOZG: 1.0000 | LOZENGE | OROMUCOSAL | Status: DC | PRN

## 2017-06-29 MED ORDER — POLYETHYLENE GLYCOL 3350 17 G PO PACK
17.0000 g | PACK | Freq: Every day | ORAL | Status: DC | PRN
  Administered 2017-07-02: 17 g via ORAL
  Filled 2017-06-29: qty 1

## 2017-06-29 MED ORDER — CHLORHEXIDINE GLUCONATE 4 % EX LIQD: 60.0000 mL | Freq: Once | CUTANEOUS | Status: DC

## 2017-06-29 MED ORDER — METOCLOPRAMIDE HCL 5 MG PO TABS: 5.0000 mg | ORAL_TABLET | Freq: Three times a day (TID) | ORAL | Status: DC | PRN

## 2017-06-29 MED ORDER — HYDROCODONE-ACETAMINOPHEN 7.5-325 MG PO TABS
ORAL_TABLET | ORAL | Status: AC
  Filled 2017-06-29: qty 1

## 2017-06-29 MED ORDER — OXYCODONE HCL 5 MG PO TABS
10.0000 mg | ORAL_TABLET | ORAL | Status: DC | PRN
  Administered 2017-06-29 – 2017-07-01 (×7): 15 mg via ORAL
  Administered 2017-07-01: 10 mg via ORAL
  Administered 2017-07-01 – 2017-07-02 (×3): 15 mg via ORAL
  Filled 2017-06-29 (×11): qty 3

## 2017-06-29 MED ORDER — 0.9 % SODIUM CHLORIDE (POUR BTL) OPTIME
TOPICAL | Status: DC | PRN
  Administered 2017-06-29: 1000 mL

## 2017-06-29 MED ORDER — ACETAMINOPHEN 10 MG/ML IV SOLN
INTRAVENOUS | Status: DC | PRN
  Administered 2017-06-29: 1000 mg via INTRAVENOUS

## 2017-06-29 MED ORDER — CLINDAMYCIN PHOSPHATE 900 MG/50ML IV SOLN
900.0000 mg | INTRAVENOUS | Status: AC
  Administered 2017-06-29: 900 mg via INTRAVENOUS
  Filled 2017-06-29: qty 50

## 2017-06-29 MED ORDER — MIDAZOLAM HCL 2 MG/2ML IJ SOLN
INTRAMUSCULAR | Status: AC
Start: 2017-06-29 — End: ?
  Filled 2017-06-29: qty 2

## 2017-06-29 MED ORDER — OXYCODONE HCL 5 MG PO TABS
5.0000 mg | ORAL_TABLET | ORAL | Status: DC | PRN
  Administered 2017-07-01: 10 mg via ORAL
  Filled 2017-06-29 (×2): qty 2

## 2017-06-29 MED ORDER — ONDANSETRON HCL 4 MG PO TABS: 4.0000 mg | ORAL_TABLET | Freq: Four times a day (QID) | ORAL | Status: DC | PRN

## 2017-06-29 MED ORDER — PHENOL 1.4 % MT LIQD: 1.0000 | OROMUCOSAL | Status: DC | PRN

## 2017-06-29 MED ORDER — TRANEXAMIC ACID 1000 MG/10ML IV SOLN
2000.0000 mg | INTRAVENOUS | Status: DC
  Filled 2017-06-29: qty 20

## 2017-06-29 MED ORDER — GABAPENTIN 100 MG PO CAPS
100.0000 mg | ORAL_CAPSULE | Freq: Three times a day (TID) | ORAL | Status: DC
  Administered 2017-06-29 – 2017-07-01 (×8): 100 mg via ORAL
  Filled 2017-06-29 (×8): qty 1

## 2017-06-29 MED ORDER — HYDROMORPHONE HCL 2 MG/ML IJ SOLN
0.5000 mg | INTRAMUSCULAR | Status: DC | PRN
  Administered 2017-06-29: 1 mg via INTRAVENOUS
  Filled 2017-06-29: qty 1

## 2017-06-29 MED ORDER — DIPHENHYDRAMINE HCL 12.5 MG/5ML PO ELIX
12.5000 mg | ORAL_SOLUTION | ORAL | Status: DC | PRN
  Administered 2017-06-29: 25 mg via ORAL
  Filled 2017-06-29: qty 10

## 2017-06-29 MED ORDER — ACETAMINOPHEN 10 MG/ML IV SOLN
1000.0000 mg | Freq: Once | INTRAVENOUS | Status: DC | PRN
  Administered 2017-06-29: 1000 mg via INTRAVENOUS

## 2017-06-29 MED ORDER — HYDROCODONE-ACETAMINOPHEN 7.5-325 MG PO TABS
1.0000 | ORAL_TABLET | Freq: Once | ORAL | Status: AC | PRN
  Administered 2017-06-29: 1 via ORAL

## 2017-06-29 MED ORDER — FENTANYL CITRATE (PF) 100 MCG/2ML IJ SOLN
INTRAMUSCULAR | Status: DC | PRN
  Administered 2017-06-29 (×3): 50 ug via INTRAVENOUS
  Administered 2017-06-29: 100 ug via INTRAVENOUS

## 2017-06-29 MED ORDER — MIDAZOLAM HCL 5 MG/5ML IJ SOLN
INTRAMUSCULAR | Status: DC | PRN
  Administered 2017-06-29: 2 mg via INTRAVENOUS

## 2017-06-29 MED ORDER — METHOCARBAMOL 1000 MG/10ML IJ SOLN
500.0000 mg | Freq: Four times a day (QID) | INTRAVENOUS | Status: DC | PRN
  Filled 2017-06-29: qty 5

## 2017-06-29 SURGICAL SUPPLY — 53 items
BENZOIN TINCTURE PRP APPL 2/3 (GAUZE/BANDAGES/DRESSINGS) ×3 IMPLANT
BLADE CLIPPER SURG (BLADE) IMPLANT
BLADE SAW SGTL 18X1.27X75 (BLADE) ×2 IMPLANT
BLADE SAW SGTL 18X1.27X75MM (BLADE) ×1
CAPT HIP TOTAL 2 ×3 IMPLANT
CELLS DAT CNTRL 66122 CELL SVR (MISCELLANEOUS) ×1 IMPLANT
CLOSURE STERI-STRIP 1/2X4 (GAUZE/BANDAGES/DRESSINGS) ×1
CLOSURE WOUND 1/2 X4 (GAUZE/BANDAGES/DRESSINGS) ×2
CLSR STERI-STRIP ANTIMIC 1/2X4 (GAUZE/BANDAGES/DRESSINGS) ×2 IMPLANT
COVER SURGICAL LIGHT HANDLE (MISCELLANEOUS) ×3 IMPLANT
DRAPE C-ARM 42X72 X-RAY (DRAPES) ×3 IMPLANT
DRAPE STERI IOBAN 125X83 (DRAPES) ×3 IMPLANT
DRAPE U-SHAPE 47X51 STRL (DRAPES) ×9 IMPLANT
DRSG AQUACEL AG ADV 3.5X10 (GAUZE/BANDAGES/DRESSINGS) ×3 IMPLANT
DURAPREP 26ML APPLICATOR (WOUND CARE) ×3 IMPLANT
ELECT BLADE 4.0 EZ CLEAN MEGAD (MISCELLANEOUS) ×3
ELECT BLADE 6.5 EXT (BLADE) IMPLANT
ELECT REM PT RETURN 9FT ADLT (ELECTROSURGICAL) ×3
ELECTRODE BLDE 4.0 EZ CLN MEGD (MISCELLANEOUS) ×1 IMPLANT
ELECTRODE REM PT RTRN 9FT ADLT (ELECTROSURGICAL) ×1 IMPLANT
FACESHIELD WRAPAROUND (MASK) ×6 IMPLANT
GLOVE BIOGEL PI IND STRL 8 (GLOVE) ×2 IMPLANT
GLOVE BIOGEL PI INDICATOR 8 (GLOVE) ×4
GLOVE ECLIPSE 8.0 STRL XLNG CF (GLOVE) ×3 IMPLANT
GLOVE ORTHO TXT STRL SZ7.5 (GLOVE) ×6 IMPLANT
GOWN STRL REUS W/ TWL LRG LVL3 (GOWN DISPOSABLE) ×2 IMPLANT
GOWN STRL REUS W/ TWL XL LVL3 (GOWN DISPOSABLE) ×2 IMPLANT
GOWN STRL REUS W/TWL LRG LVL3 (GOWN DISPOSABLE) ×4
GOWN STRL REUS W/TWL XL LVL3 (GOWN DISPOSABLE) ×4
HANDPIECE INTERPULSE COAX TIP (DISPOSABLE) ×2
KIT BASIN OR (CUSTOM PROCEDURE TRAY) ×3 IMPLANT
KIT TURNOVER KIT B (KITS) ×3 IMPLANT
MANIFOLD NEPTUNE II (INSTRUMENTS) ×3 IMPLANT
NS IRRIG 1000ML POUR BTL (IV SOLUTION) ×3 IMPLANT
PACK TOTAL JOINT (CUSTOM PROCEDURE TRAY) ×3 IMPLANT
PAD ARMBOARD 7.5X6 YLW CONV (MISCELLANEOUS) ×3 IMPLANT
RTRCTR WOUND ALEXIS 18CM MED (MISCELLANEOUS) ×3
SET HNDPC FAN SPRY TIP SCT (DISPOSABLE) ×1 IMPLANT
STAPLER VISISTAT 35W (STAPLE) IMPLANT
STRIP CLOSURE SKIN 1/2X4 (GAUZE/BANDAGES/DRESSINGS) ×4 IMPLANT
SUT ETHIBOND NAB CT1 #1 30IN (SUTURE) ×3 IMPLANT
SUT MNCRL AB 4-0 PS2 18 (SUTURE) IMPLANT
SUT VIC AB 0 CT1 27 (SUTURE) ×2
SUT VIC AB 0 CT1 27XBRD ANBCTR (SUTURE) ×1 IMPLANT
SUT VIC AB 1 CT1 27 (SUTURE) ×2
SUT VIC AB 1 CT1 27XBRD ANBCTR (SUTURE) ×1 IMPLANT
SUT VIC AB 2-0 CT1 27 (SUTURE) ×2
SUT VIC AB 2-0 CT1 TAPERPNT 27 (SUTURE) ×1 IMPLANT
TOWEL OR 17X24 6PK STRL BLUE (TOWEL DISPOSABLE) ×3 IMPLANT
TOWEL OR 17X26 10 PK STRL BLUE (TOWEL DISPOSABLE) ×3 IMPLANT
TRAY CATH 16FR W/PLASTIC CATH (SET/KITS/TRAYS/PACK) IMPLANT
TRAY FOLEY MTR SLVR 16FR STAT (SET/KITS/TRAYS/PACK) IMPLANT
WATER STERILE IRR 1000ML POUR (IV SOLUTION) ×6 IMPLANT

## 2017-06-29 NOTE — H&P (Signed)
TOTAL HIP ADMISSION H&P  Patient is admitted for left total hip arthroplasty.  Subjective:  Chief Complaint: left hip pain  HPI: Maria Boyle, 57 y.o. female, has a history of pain and functional disability in the left hip(s) due to arthritis and patient has failed non-surgical conservative treatments for greater than 12 weeks to include NSAID's and/or analgesics, corticosteriod injections, flexibility and strengthening excercises, supervised PT with diminished ADL's post treatment, use of assistive devices, weight reduction as appropriate and activity modification.  Onset of symptoms was gradual starting 3 years ago with gradually worsening course since that time.The patient noted no past surgery on the left hip(s).  Patient currently rates pain in the left hip at 10 out of 10 with activity. Patient has night pain, worsening of pain with activity and weight bearing, trendelenberg gait, pain that interfers with activities of daily living and pain with passive range of motion. Patient has evidence of subchondral cysts, subchondral sclerosis, periarticular osteophytes and joint space narrowing by imaging studies. This condition presents safety issues increasing the risk of falls.  There is no current active infection.  Patient Active Problem List   Diagnosis Date Noted  . Unilateral primary osteoarthritis, left hip 06/29/2017  . Hypothyroidism 05/24/2016  . Essential hypertension 09/24/2015   Past Medical History:  Diagnosis Date  . Hypertension   . Lactose intolerance   . Thyroid disease     Past Surgical History:  Procedure Laterality Date  . CESAREAN SECTION    . HIP ARTHROPLASTY Right   . THYROIDECTOMY      Current Facility-Administered Medications  Medication Dose Route Frequency Provider Last Rate Last Dose  . chlorhexidine (HIBICLENS) 4 % liquid 4 application  60 mL Topical Once Richardean Canal W, PA-C      . clindamycin (CLEOCIN) IVPB 900 mg  900 mg Intravenous To SS-Surg  Kathryne Hitch, MD      . lactated ringers infusion   Intravenous Continuous Trevor Iha, MD 50 mL/hr at 06/29/17 1304    . tranexamic acid (CYKLOKAPRON) 1,000 mg in sodium chloride 0.9 % 100 mL IVPB  1,000 mg Intravenous To OR Kathryne Hitch, MD      . tranexamic acid (CYKLOKAPRON) 2,000 mg in sodium chloride 0.9 % 50 mL Topical Application  2,000 mg Topical To OR Kathryne Hitch, MD       Allergies  Allergen Reactions  . Penicillins Rash    Has patient had a PCN reaction causing immediate rash, facial/tongue/throat swelling, SOB or lightheadedness with hypotension: Unknown Has patient had a PCN reaction causing severe rash involving mucus membranes or skin necrosis: Unknown Has patient had a PCN reaction that required hospitalization: Unknown Has patient had a PCN reaction occurring within the last 10 years: No Childhood allergy If all of the above answers are "NO", then may proceed with Cephalosporin use.     Social History   Tobacco Use  . Smoking status: Current Every Day Smoker    Types: Cigarettes  . Smokeless tobacco: Never Used  Substance Use Topics  . Alcohol use: Yes    Comment: social occasions    Family History  Problem Relation Age of Onset  . Hypercalcemia Mother   . Hypertension Mother   . Hypertension Father   . Cancer Paternal Grandmother   . Diabetes Other      Review of Systems  Musculoskeletal: Positive for joint pain.  All other systems reviewed and are negative.   Objective:  Physical Exam  Constitutional: She  is oriented to person, place, and time. She appears well-developed and well-nourished.  HENT:  Head: Normocephalic and atraumatic.  Eyes: Pupils are equal, round, and reactive to light. EOM are normal.  Neck: Normal range of motion. Neck supple.  Cardiovascular: Normal rate and regular rhythm.  Respiratory: Effort normal and breath sounds normal.  GI: Soft. Bowel sounds are normal.  Musculoskeletal:        Left hip: She exhibits decreased range of motion, decreased strength, tenderness and bony tenderness.  Neurological: She is alert and oriented to person, place, and time.  Skin: Skin is warm and dry.  Psychiatric: She has a normal mood and affect.    Vital signs in last 24 hours: Temp:  [98.3 F (36.8 C)] 98.3 F (36.8 C) (05/28 1239) Pulse Rate:  [51] 51 (05/28 1239) Resp:  [18] 18 (05/28 1239) BP: (170)/(83) 170/83 (05/28 1239) SpO2:  [100 %] 100 % (05/28 1239) Weight:  [132 lb (59.9 kg)] 132 lb (59.9 kg) (05/28 1239)  Labs:   Estimated body mass index is 21.97 kg/m as calculated from the following:   Height as of this encounter:  (1.651 m).   Weight as of this encounter: 132 lb (59.9 kg).   Imaging Review Plain radiographs demonstrate severe degenerative joint disease of the left hip(s). The bone quality appears to be excellent for age and reported activity level.    Preoperative templating of the joint replacement has been completed, documented, and submitted to the Operating Room personnel in order to optimize intra-operative equipment management.     Assessment/Plan:  End stage arthritis, left hip(s)  The patient history, physical examination, clinical judgement of the provider and imaging studies are consistent with end stage degenerative joint disease of the left hip(s) and total hip arthroplasty is deemed medically necessary. The treatment options including medical management, injection therapy, arthroscopy and arthroplasty were discussed at length. The risks and benefits of total hip arthroplasty were presented and reviewed. The risks due to aseptic loosening, infection, stiffness, dislocation/subluxation,  thromboembolic complications and other imponderables were discussed.  The patient acknowledged the explanation, agreed to proceed with the plan and consent was signed. Patient is being admitted for inpatient treatment for surgery, pain control, PT, OT,  prophylactic antibiotics, VTE prophylaxis, progressive ambulation and ADL's and discharge planning.The patient is planning to be discharged home with home health services

## 2017-06-29 NOTE — Progress Notes (Signed)
Patient daughter called nurse to patient room asking what time patient got out of surgery in a very rude manner. I informed daughter I was not here when patient got out of surgery so I cannot tell her the exact time.  Daughter then asked for vital signs of patient. I informed daughter the tech is on her way checking her vital signs and will be in next. Daughter then  Requested for pain medication for mum. Nurse informed daughter I will have to check in the computer to see what medications she receiving for pain. I informed patient she is due for oxycodone now.

## 2017-06-29 NOTE — Anesthesia Procedure Notes (Signed)
Procedure Name: Intubation Date/Time: 06/29/2017 2:17 PM Performed by: Candis Shine, CRNA Pre-anesthesia Checklist: Patient identified, Emergency Drugs available, Suction available and Patient being monitored Patient Re-evaluated:Patient Re-evaluated prior to induction Oxygen Delivery Method: Circle System Utilized Preoxygenation: Pre-oxygenation with 100% oxygen Induction Type: IV induction Ventilation: Mask ventilation without difficulty and Oral airway inserted - appropriate to patient size Laryngoscope Size: Mac and 3 Grade View: Grade I Tube type: Oral Tube size: 7.0 mm Number of attempts: 1 Airway Equipment and Method: Stylet and Oral airway Placement Confirmation: ETT inserted through vocal cords under direct vision,  positive ETCO2 and breath sounds checked- equal and bilateral Secured at: 22 cm Tube secured with: Tape Dental Injury: Teeth and Oropharynx as per pre-operative assessment

## 2017-06-29 NOTE — Progress Notes (Signed)
Beside report given to Maria Boyle. Patient apologized to me and saying she does not understand what happened.

## 2017-06-29 NOTE — Transfer of Care (Signed)
Immediate Anesthesia Transfer of Care Note  Patient: Maria Boyle  Procedure(s) Performed: LEFT TOTAL HIP ARTHROPLASTY ANTERIOR APPROACH (Left )  Patient Location: PACU  Anesthesia Type:General  Level of Consciousness: awake, alert  and oriented  Airway & Oxygen Therapy: Patient Spontanous Breathing and Patient connected to nasal cannula oxygen  Post-op Assessment: Report given to RN and Post -op Vital signs reviewed and stable  Post vital signs: Reviewed and stable  Last Vitals:  Vitals Value Taken Time  BP 179/92 06/29/2017  3:59 PM  Temp    Pulse 72 06/29/2017  4:02 PM  Resp 17 06/29/2017  4:02 PM  SpO2 95 % 06/29/2017  4:02 PM  Vitals shown include unvalidated device data.  Last Pain:  Vitals:   06/29/17 1239  TempSrc: Oral      Patients Stated Pain Goal: 3 (06/29/17 1249)  Complications: No apparent anesthesia complications

## 2017-06-29 NOTE — Progress Notes (Signed)
Patient informed I have oxycodone 15 mg for her . I informed patient I will have to recheck her vital signs before giving her oxycodone.  Daughter informed me she does not want patient to received oxycodone and she will like to speak to the charge nurse. Charge nurse called for daughter.

## 2017-06-29 NOTE — Progress Notes (Signed)
Patient states that she wears necklace for religous reasons and does not want to take it off.  Patient also states that nose ring can not be removed.  Dr. Richardson Landry aware and stated that necklace and nose ring could stay on as long as both were taped.  Dr. Richardson Landry educated patient on risk on wearing jewelry and patient verbalized understanding.

## 2017-06-29 NOTE — Brief Op Note (Signed)
06/29/2017  3:37 PM  PATIENT:  Maria Boyle  57 y.o. female  PRE-OPERATIVE DIAGNOSIS:  osteoarthritis left hip  POST-OPERATIVE DIAGNOSIS:  osteoarthritis left hip  PROCEDURE:  Procedure(s): LEFT TOTAL HIP ARTHROPLASTY ANTERIOR APPROACH (Left)  SURGEON:  Surgeon(s) and Role:    Kathryne Hitch, MD - Primary  PHYSICIAN ASSISTANT: Rexene Edison, PA-C assisted  ANESTHESIA:   general  EBL:  300 mL   COUNTS:  YES  DICTATION: .Other Dictation: Dictation Number [id number]  PLAN OF CARE: Admit to inpatient   PATIENT DISPOSITION:  PACU - hemodynamically stable.   Delay start of Pharmacological VTE agent (>24hrs) due to surgical blood loss or risk of bleeding: no

## 2017-06-29 NOTE — Op Note (Signed)
NAMENEAVE, LENGER MEDICAL RECORD ZO:[id number] ACCOUNT [id number] DATE OF BIRTH:08-15-1960 FACILITY: [facility] LOCATION: [facility] PHYSICIAN:Tarez Bowns Aretha Parrot, MD  OPERATIVE REPORT  DATE OF PROCEDURE:  06/29/2017  DATE OF PROCEDURE:  06/29/2017   PREOPERATIVE DIAGNOSIS:  Primary osteoarthritis and degenerative joint disease, left hip.  POSTOPERATIVE DIAGNOSIS:  Primary osteoarthritis and degenerative joint disease, left hip.  PROCEDURE PERFORMED:  Left total hip arthroplasty through direct anterior approach.  IMPLANTS:  DePuy Sector Gription acetabular component size 50, size 32+0 polyethylene liner, size 12 Corail femoral component with standard offset, size 32+1 ceramic hip ball.  SURGEON:  Vanita Panda. Magnus Ivan, MD  ASSISTANT:  Rexene Edison, PA-C   ANESTHESIA:  General.  ANTIBIOTICS:  900 mg IV clindamycin.  ESTIMATED BLOOD LOSS:  300 mL.  COMPLICATIONS:  None.  INDICATIONS:  The patient is a petite 57 year old female with debilitating end-stage arthritis involving her left hip.  Her pain is daily, and it has got to where there is a detrimental effect on her activities of daily living, her quality of life, and  her mobility.  Her x-rays show complete loss of the joint space with flattening of the femoral head.  She actually has a right total hip arthroplasty that was done through a posterior approach over 8 years ago.  At this point with the failure of  conservative treatment on her left hip, she does wish to proceed with a left total hip arthroplasty.  She understands fully the risk of acute blood loss anemia, nerve or vessel injury, fracture, infection, dislocation, DVT.  She understands her goals are  to decrease pain, improve mobility, and overall improve quality of life.  DESCRIPTION OF PROCEDURE:  After informed consent was obtained, the appropriate left hip was marked.  She was brought to the operating room, and general anesthesia was obtained while she was on a  stretcher.  A Foley catheter was placed.  Both feet had  traction boots applied to them.  She was next placed supine on the Hana fracture table with a perineal post in place and both legs in in-line skeletal traction devices and no traction applied.  Of note, her preoperative leg lengths were equal even though  her x-rays are different.  Her actual clinical preoperative leg lengths were equal.  Her left operative hip was prepped and draped with DuraPrep and sterile drapes.  A time-out was called, and she was identified as correct patient, correct left hip.  I  then made an incision just inferior and posterior to the anterior superior iliac spine and carried this obliquely down the leg.  I dissected down to tensor fascia lata muscle.  The tensor fascia was then divided longitudinally to proceed with a direct  anterior approach to the hip.  We identified and cauterized the circumflex vessels.  We then identified the hip capsule.  I opened up the hip capsule in an L-type format, finding a moderate joint effusion and significant periarticular osteophytes  throughout the femoral head and neck region.  We placed Cobra retractors around the medial and lateral femoral neck and then made our femoral neck cut with an oscillating saw proximal to the lesser trochanter and completed this with an osteotome, placed  a corkscrew down the femoral head and removed the femoral head in its entirety and found it to be completely devoid of cartilage with flattening and significant disease.  We then placed a bent Hohmann at the medial acetabular rim and removed remnants of  the acetabular labrum and other debris.  We  then began reaming under direct visualization in stepwise increments from a 43 up to a size 49.  With the 49 reamer, we placed this under direct fluoroscopy as well, so we could obtain our depth of reaming and  our inclination and anteversion.  Once we were pleased with this, we placed the true DePuy Sector Gription  acetabular component size 50 and a 32+0 polyethylene liner for the size 50 acetabular component.  Attention was then turned to the femur.  With the  leg externally rotated to 120 degrees, extended and abducted, we placed a Mueller retractor medially and a Hohmann retractor behind the greater trochanter.  We released the lateral joint capsule and used a box-cutting osteotome and to enter the femoral  canal and a rongeur to lateralize and then began broaching from a size 8 broach using Corail broaching system size 12 and placed a trial standard offset femoral neck and a 32+1 trial hip ball.  We brought the leg back over in upward traction and internal  rotation, reducing the pelvis, and we were pleased with leg length, offset, range of motion and stability.  We then dislocated the hip and removed the trial components.  We placed the real Corail femoral component with a size 12 with standard offset for  left hip and the real 32+1 ceramic hip ball.  Again, reduced this in the acetabulum.  We were pleased with stability.  We then irrigated the soft tissues with normal saline solution using pulsatile lavage.  We closed the joint capsule with interrupted  #1 Ethibond suture, followed by running #1 Vicryl in the tensor fascia, 0 Vicryl in the deep tissue, 2-0 Vicryl subcutaneous tissue, 4-0 Monocryl subcuticular stitch and Steri-Strips on the skin.  An Aquacel dressing was applied.  She was taken off the  Hana table, awakened, extubated, and taken to recovery room in stable condition.  All final counts were correct.  No complications noted.  Of note, Rexene Edison, PA-C, assisted in the entire case.  His assistance was crucial for facilitating all aspects of  this case.  LN/NUANCE  D:06/29/2017 T:06/29/2017 JOB:[id number]

## 2017-06-29 NOTE — Anesthesia Postprocedure Evaluation (Signed)
Anesthesia Post Note  Patient: Maria Boyle  Procedure(s) Performed: LEFT TOTAL HIP ARTHROPLASTY ANTERIOR APPROACH (Left )     Patient location during evaluation: PACU Anesthesia Type: General Level of consciousness: awake and alert Pain management: pain level controlled Vital Signs Assessment: post-procedure vital signs reviewed and stable Respiratory status: spontaneous breathing, nonlabored ventilation and respiratory function stable Cardiovascular status: blood pressure returned to baseline and stable Postop Assessment: no apparent nausea or vomiting Anesthetic complications: no    Last Vitals:  Vitals:   06/29/17 1700 06/29/17 1715  BP: (!) 157/97 (!) 165/90  Pulse: (!) 48 (!) 50  Resp: 16 17  Temp:  (!) 36.3 C  SpO2: 100% 100%    Last Pain:  Vitals:   06/29/17 1715  TempSrc:   PainSc: 6                  Lowella Curb

## 2017-06-29 NOTE — Anesthesia Preprocedure Evaluation (Addendum)
Anesthesia Evaluation  Patient identified by MRN, date of birth, ID band Patient awake    Reviewed: Allergy & Precautions, NPO status , Patient's Chart, lab work & pertinent test results  Airway Mallampati: I  TM Distance: >3 FB Neck ROM: Full    Dental no notable dental hx. (+) Dental Advisory Given, Upper Dentures   Pulmonary Current Smoker,    Pulmonary exam normal breath sounds clear to auscultation       Cardiovascular hypertension, Pt. on medications negative cardio ROS Normal cardiovascular exam Rhythm:Regular Rate:Normal     Neuro/Psych negative neurological ROS  negative psych ROS   GI/Hepatic negative GI ROS, Neg liver ROS,   Endo/Other  Hypothyroidism   Renal/GU negative Renal ROS     Musculoskeletal   Abdominal   Peds  Hematology   Anesthesia Other Findings   Reproductive/Obstetrics                            Lab Results  Component Value Date   WBC 5.6 06/25/2017   HGB 14.0 06/25/2017   HCT 43.5 06/25/2017   MCV 97.1 06/25/2017   PLT 237 06/25/2017    Anesthesia Physical Anesthesia Plan  ASA: III  Anesthesia Plan: General   Post-op Pain Management:    Induction: Intravenous  PONV Risk Score and Plan: Treatment may vary due to age or medical condition and Ondansetron  Airway Management Planned: Oral ETT  Additional Equipment:   Intra-op Plan:   Post-operative Plan: Extubation in OR  Informed Consent: I have reviewed the patients History and Physical, chart, labs and discussed the procedure including the risks, benefits and alternatives for the proposed anesthesia with the patient or authorized representative who has indicated his/her understanding and acceptance.   Dental advisory given  Plan Discussed with: CRNA  Anesthesia Plan Comments:       Anesthesia Quick Evaluation

## 2017-06-30 ENCOUNTER — Encounter (HOSPITAL_COMMUNITY): Payer: Self-pay | Admitting: Orthopaedic Surgery

## 2017-06-30 ENCOUNTER — Other Ambulatory Visit: Payer: Self-pay

## 2017-06-30 LAB — CBC
HCT: 36.2 % (ref 36.0–46.0)
HEMOGLOBIN: 11.3 g/dL — AB (ref 12.0–15.0)
MCH: 31 pg (ref 26.0–34.0)
MCHC: 31.2 g/dL (ref 30.0–36.0)
MCV: 99.5 fL (ref 78.0–100.0)
Platelets: 166 10*3/uL (ref 150–400)
RBC: 3.64 MIL/uL — ABNORMAL LOW (ref 3.87–5.11)
RDW: 14.3 % (ref 11.5–15.5)
WBC: 11.2 10*3/uL — ABNORMAL HIGH (ref 4.0–10.5)

## 2017-06-30 LAB — BASIC METABOLIC PANEL
Anion gap: 8 (ref 5–15)
BUN: 8 mg/dL (ref 6–20)
CALCIUM: 9.6 mg/dL (ref 8.9–10.3)
CHLORIDE: 108 mmol/L (ref 101–111)
CO2: 28 mmol/L (ref 22–32)
CREATININE: 0.91 mg/dL (ref 0.44–1.00)
GFR calc non Af Amer: >60 mL/min (ref >60)
Glucose, Bld: 112 mg/dL — ABNORMAL HIGH (ref 65–99)
Potassium: 4.1 mmol/L (ref 3.5–5.1)
SODIUM: 144 mmol/L (ref 135–145)

## 2017-06-30 NOTE — Progress Notes (Signed)
Subjective: 1 Day Post-Op Procedure(s) (LRB): LEFT TOTAL HIP ARTHROPLASTY ANTERIOR APPROACH (Left) Patient reports pain as moderate.    Objective: Vital signs in last 24 hours: Temp:  [97.3 F (36.3 C)-98.8 F (37.1 C)] 98.8 F (37.1 C) (05/29 0451) Pulse Rate:  [48-68] 57 (05/29 0451) Resp:  [14-19] 16 (05/29 0451) BP: (127-182)/(77-105) 127/81 (05/29 0451) SpO2:  [97 %-100 %] 100 % (05/29 0451) Weight:  [132 lb (59.9 kg)] 132 lb (59.9 kg) (05/28 1239)  Intake/Output from previous day: 05/28 0701 - 05/29 0700 In: 1370 [P.O.:120; I.V.:1000; IV Piggyback:100] Out: 550 [Urine:250; Blood:300] Intake/Output this shift: No intake/output data recorded.  Recent Labs    06/30/17 0448  HGB 11.3*   Recent Labs    06/30/17 0448  WBC 11.2*  RBC 3.64*  HCT 36.2  PLT 166   Recent Labs    06/30/17 0448  NA 144  K 4.1  CL 108  CO2 28  BUN 8  CREATININE 0.91  GLUCOSE 112*  CALCIUM 9.6   No results for input(s): LABPT, INR in the last 72 hours.  Sensation intact distally Intact pulses distally Dorsiflexion/Plantar flexion intact Incision: dressing C/D/I  Assessment/Plan: 1 Day Post-Op Procedure(s) (LRB): LEFT TOTAL HIP ARTHROPLASTY ANTERIOR APPROACH (Left) Up with therapy    Kathryne Hitch 06/30/2017, 7:16 AM

## 2017-06-30 NOTE — Evaluation (Signed)
Occupational Therapy Evaluation Patient Details Name: Maria Boyle MRN: [id number] DOB: 12/03/1960 Today's Date: 06/30/2017    History of Present Illness Pt is a 57 y/o female s/p L THA, direct anterior approach. PMH including but not limited to HTN and hypothyroidism.   Clinical Impression   This 57 y/o female presents with the above. At baseline pt is mod independent with ADLs and functional mobility using SPC, was working as a Quarry manager. Pt completing functional mobility, toileting and standing grooming ADLs using RW with minguard assist; demonstrates LB ADLs with minguard, tub transfers with minA. Pt reports she will return home with spouse and daughter who are able to provide supervision/assist PRN. Will continue to follow acutely to maximize her safety and independence with ADLs and mobility prior to discharge home.     Follow Up Recommendations  Follow surgeon's recommendation for DC plan and follow-up therapies;Supervision/Assistance - 24 hour    Equipment Recommendations  None recommended by OT;Other (comment)(pt's DME needs are met )           Precautions / Restrictions Precautions Precautions: None Restrictions Weight Bearing Restrictions: Yes LLE Weight Bearing: Weight bearing as tolerated      Mobility Bed Mobility Overal bed mobility: Modified Independent                Transfers Overall transfer level: Modified independent Equipment used: None                  Balance Overall balance assessment: Needs assistance Sitting-balance support: Feet supported Sitting balance-Leahy Scale: Good     Standing balance support: During functional activity;No upper extremity supported Standing balance-Leahy Scale: Fair Standing balance comment: able to static stand without UE support                            ADL either performed or assessed with clinical judgement   ADL Overall ADL's : Needs assistance/impaired Eating/Feeding: Modified  independent;Sitting   Grooming: Wash/dry hands;Supervision/safety;Standing   Upper Body Bathing: Min guard;Sitting   Lower Body Bathing: Min guard;Sit to/from stand   Upper Body Dressing : Set up;Sitting   Lower Body Dressing: Min guard;Sit to/from stand   Toilet Transfer: Supervision/safety;Ambulation;Regular Toilet;Grab bars   Toileting- Clothing Manipulation and Hygiene: Min guard;Sit to/from stand   Tub/ Shower Transfer: Tub transfer;Minimal assistance;Min guard;Ambulation;Rolling walker;3 in 1 Tub/Shower Transfer Details (indicate cue type and reason): verbal cues for safe technique, pt completing x2 trials; initially minA, progressed to minguard assist  Functional mobility during ADLs: Min guard;Rolling walker                           Pertinent Vitals/Pain Pain Assessment: Faces Faces Pain Scale: Hurts little more Pain Location: L hip Pain Descriptors / Indicators: Sore Pain Intervention(s): Monitored during session          Extremity/Trunk Assessment Upper Extremity Assessment Upper Extremity Assessment: Overall WFL for tasks assessed   Lower Extremity Assessment Lower Extremity Assessment: Defer to PT evaluation   Cervical / Trunk Assessment Cervical / Trunk Assessment: Normal   Communication Communication Communication: No difficulties   Cognition Arousal/Alertness: Awake/alert Behavior During Therapy: WFL for tasks assessed/performed Overall Cognitive Status: Within Functional Limits for tasks assessed  Home Living Family/patient expects to be discharged to:: Private residence Living Arrangements: Spouse/significant other;Children Available Help at Discharge: Family;Available 24 hours/day Type of Home: Other(Comment)(townhouse ) Home Access: Level entry     Home Layout: Two level Alternate Level Stairs-Number of Steps: flight Alternate Level Stairs-Rails:  Right;Left Bathroom Shower/Tub: Teacher, early years/pre: Standard     Home Equipment: Cane - single point;Bedside commode          Prior Functioning/Environment Level of Independence: Independent with assistive device(s)        Comments: pt was using a SPC to ambulate, but 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        OT Problem List: Decreased strength;Impaired balance (sitting and/or standing);Decreased activity tolerance      OT Treatment/Interventions: Self-care/ADL training;DME and/or AE instruction;Balance training;Therapeutic activities;Therapeutic exercise;Patient/family education    OT Goals(Current goals can be found in the care plan section) Acute Rehab OT Goals Patient Stated Goal: return home and return to work OT Goal Formulation: With patient Time For Goal Achievement: 07/14/17 Potential to Achieve Goals: Good  OT Frequency: Min 2X/week                             AM-PAC PT "6 Clicks" Daily Activity     Outcome Measure Help from another person eating meals?: None Help from another person taking care of personal grooming?: None Help from another person toileting, which includes using toliet, bedpan, or urinal?: A Little Help from another person bathing (including washing, rinsing, drying)?: A Little Help from another person to put on and taking off regular upper body clothing?: None Help from another person to put on and taking off regular lower body clothing?: A Little 6 Click Score: 21   End of Session Equipment Utilized During Treatment: Gait belt;Rolling walker Nurse Communication: Mobility status  Activity Tolerance: Patient tolerated treatment well Patient left: Other (comment)(handoff to PT for start of tx session )  OT Visit Diagnosis: Other abnormalities of gait and mobility (R26.89)                Time: 6237-6283 OT Time Calculation (min): 17 min Charges:  OT General Charges $OT Visit: 1 Visit OT  Evaluation $OT Eval Low Complexity: 1 Low G-Codes:     Maria Boyle, OT Pager [phone] 06/30/2017   Raymondo Band 06/30/2017, 4:39 PM

## 2017-06-30 NOTE — Evaluation (Signed)
Physical Therapy Evaluation Patient Details Name: Maria Boyle MRN: [id number] DOB: 1960/06/20 Today's Date: 06/30/2017   History of Present Illness  Pt is a 57 y/o female s/p L THA, direct anterior approach. PMH including but not limited to HTN and hypothyroidism.  Clinical Impression  Pt presented supine in bed with HOB elevated, awake and willing to participate in therapy session. Prior to admission, pt reported that she ambulated with [facility]. Pt lives with her husband and daughter who will be able to provide 24/7 supervision/assistance upon d/c if needed. Pt currently able to perform bed mobility and transfers at modified independence. Pt ambulated in hallway with RW and supervision. Plan for stair training at next session.  Pt would continue to benefit from skilled physical therapy services at this time while admitted and after d/c to address the below listed limitations in order to improve overall safety and independence with functional mobility.     Follow Up Recommendations Home health PT;Supervision - Intermittent    Equipment Recommendations  Rolling walker with 5" wheels    Recommendations for Other Services       Precautions / Restrictions Precautions Precautions: None Restrictions Weight Bearing Restrictions: Yes LLE Weight Bearing: Weight bearing as tolerated      Mobility  Bed Mobility Overal bed mobility: Modified Independent                Transfers Overall transfer level: Modified independent Equipment used: None                Ambulation/Gait Ambulation/Gait assistance: Supervision Ambulation Distance (Feet): 150 Feet Assistive device: Rolling walker (2 wheeled) Gait Pattern/deviations: Step-through pattern;Decreased stride length;Decreased step length - right;Decreased stance time - left;Decreased weight shift to left Gait velocity: decreased Gait velocity interpretation: 1.31 - 2.62 ft/sec, indicative of limited community  ambulator General Gait Details: no instability or LOB, no need for physical assisstance, supervision for safety with RW  Stairs            Wheelchair Mobility    Modified Rankin (Stroke Patients Only)       Balance Overall balance assessment: Needs assistance Sitting-balance support: Feet supported Sitting balance-Leahy Scale: Good     Standing balance support: During functional activity;No upper extremity supported Standing balance-Leahy Scale: Fair                               Pertinent Vitals/Pain Pain Assessment: Faces Faces Pain Scale: Hurts little more Pain Location: L hip Pain Descriptors / Indicators: Sore Pain Intervention(s): Monitored during session;Repositioned    Home Living Family/patient expects to be discharged to:: Private residence Living Arrangements: Spouse/significant other;Children Available Help at Discharge: Family;Available 24 hours/day Type of Home: Other(Comment)(Townhouse) Home Access: Level entry     Home Layout: Two level Home Equipment: Cane - single point;Bedside commode      Prior Function Level of Independence: Independent with assistive device(s)         Comments: pt was using a SPC to ambulate, 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 [employer]        Extremity/Trunk Assessment   Upper Extremity Assessment Upper Extremity Assessment: Defer to OT evaluation    Lower Extremity Assessment Lower Extremity Assessment: LLE deficits/detail LLE Deficits / Details: pt with mild decreased strength and AROM limitations secondary to post-op pain; sensation grossly intact throughout    Cervical / Trunk Assessment Cervical / Trunk  Assessment: Normal  Communication   Communication: No difficulties  Cognition Arousal/Alertness: Awake/alert Behavior During Therapy: WFL for tasks assessed/performed Overall Cognitive Status: Within Functional Limits for tasks assessed                                         General Comments      Exercises     Assessment/Plan    PT Assessment Patient needs continued PT services  PT Problem List Decreased strength;Decreased activity tolerance;Decreased range of motion;Decreased balance;Decreased mobility;Decreased coordination;Decreased knowledge of use of DME;Decreased safety awareness;Decreased knowledge of precautions;Pain       PT Treatment Interventions DME instruction;Gait training;Therapeutic activities;Stair training;Functional mobility training;Balance training;Neuromuscular re-education;Therapeutic exercise;Patient/family education    PT Goals (Current goals can be found in the Care Plan section)  Acute Rehab PT Goals Patient Stated Goal: return home and return to work PT Goal Formulation: With patient Time For Goal Achievement: 07/14/17 Potential to Achieve Goals: Good    Frequency 7X/week   Barriers to discharge        Co-evaluation               AM-PAC PT "6 Clicks" Daily Activity  Outcome Measure Difficulty turning over in bed (including adjusting bedclothes, sheets and blankets)?: None Difficulty moving from lying on back to sitting on the side of the bed? : None Difficulty sitting down on and standing up from a chair with arms (e.g., wheelchair, bedside commode, etc,.)?: A Little Help needed moving to and from a bed to chair (including a wheelchair)?: None Help needed walking in hospital room?: None Help needed climbing 3-5 steps with a railing? : A Little 6 Click Score: 22    End of Session   Activity Tolerance: Patient tolerated treatment well Patient left: in chair;with call bell/phone within reach Nurse Communication: Mobility status PT Visit Diagnosis: Other abnormalities of gait and mobility (R26.89);Pain Pain - Right/Left: Left Pain - part of body: Hip    Time: 1610-9604 PT Time Calculation (min) (ACUTE ONLY): 17 min   Charges:   PT Evaluation $PT Eval Moderate  Complexity: 1 Mod     PT G Codes:        [place], PT, DPT [phone]   Maria Boyle 06/30/2017, 11:23 AM

## 2017-06-30 NOTE — Care Management Note (Signed)
Case Management Note  Patient Details  Name: Jericha Bryden MRN: [id number] Date of Birth: 1960/10/29  Subjective/Objective:   57 yr old female s/p left total hip arthroplasty.                 Action/Plan: Case manager spoke with patient concerning discharge plan and DME. Referral for Home Health called to Shon Millet RN [facility] Liaison. Patient will have support at discharge.  Expected Discharge Date:   07/01/17               Expected Discharge Plan:  Home w Home Health Services  In-House Referral:  NA  Discharge planning Services  CM Consult  Post Acute Care Choice:  Durable Medical Equipment, Home Health Choice offered to:  Patient  DME Arranged:  Walker rolling(declines 3in1) DME Agency:  [facility]  HH Arranged:  PT HH Agency:  [facility]  Status of Service:  Completed, signed off  If discussed at Long Length of Stay Meetings, dates discussed:    Additional Comments:  Durenda Guthrie, RN 06/30/2017, 3:21 PM

## 2017-06-30 NOTE — Progress Notes (Signed)
Physical Therapy Treatment Patient Details Name: Maria Boyle MRN: [id number] DOB: 1960-07-23 Today's Date: 06/30/2017    History of Present Illness Pt is a 57 y/o female s/p L THA, direct anterior approach. PMH including but not limited to HTN and hypothyroidism.    PT Comments    Pt making excellent progress with functional mobility and successfully completed stair training this session. Pt requesting further stair training next session. Pt would continue to benefit from skilled physical therapy services at this time while admitted and after d/c to address the below listed limitations in order to improve overall safety and independence with functional mobility.    Follow Up Recommendations  Home health PT;Supervision - Intermittent     Equipment Recommendations  Rolling walker with 5" wheels    Recommendations for Other Services       Precautions / Restrictions Precautions Precautions: None Restrictions Weight Bearing Restrictions: Yes LLE Weight Bearing: Weight bearing as tolerated    Mobility  Bed Mobility Overal bed mobility: Modified Independent                Transfers Overall transfer level: Modified independent Equipment used: Rolling walker (2 wheeled)                Ambulation/Gait Ambulation/Gait assistance: Supervision Ambulation Distance (Feet): 200 Feet Assistive device: Rolling walker (2 wheeled) Gait Pattern/deviations: Step-through pattern;Decreased stride length;Decreased step length - right;Decreased stance time - left;Decreased weight shift to left Gait velocity: decreased Gait velocity interpretation: 1.31 - 2.62 ft/sec, indicative of limited community ambulator General Gait Details: no instability or LOB, no need for physical assisstance, supervision for safety with RW   Stairs Stairs: Yes Stairs assistance: Min guard Stair Management: One rail Left;One rail Right;Two rails;Step to pattern;Forwards Number of Stairs: 5(x3  bouts) General stair comments: cueing for sequencing, min guard for safety   Wheelchair Mobility    Modified Rankin (Stroke Patients Only)       Balance Overall balance assessment: Needs assistance Sitting-balance support: Feet supported Sitting balance-Leahy Scale: Good     Standing balance support: During functional activity;No upper extremity supported Standing balance-Leahy Scale: Fair Standing balance comment: able to static stand without UE support                             Cognition Arousal/Alertness: Awake/alert Behavior During Therapy: WFL for tasks assessed/performed Overall Cognitive Status: Within Functional Limits for tasks assessed                                        Exercises Total Joint Exercises Hip ABduction/ADduction: AROM;Strengthening;Left;10 reps;Standing Marching in Standing: AROM;Strengthening;Left;10 reps Standing Hip Extension: AROM;Strengthening;Left;10 reps;Standing    General Comments        Pertinent Vitals/Pain Pain Assessment: Faces Faces Pain Scale: Hurts little more Pain Location: L hip Pain Descriptors / Indicators: Sore Pain Intervention(s): Monitored during session;Repositioned    Home Living Family/patient expects to be discharged to:: Private residence Living Arrangements: Spouse/significant other;Children Available Help at Discharge: Family;Available 24 hours/day Type of Home: Other(Comment)(townhouse ) Home Access: Level entry   Home Layout: Two level Home Equipment: Cane - single point;Bedside commode      Prior Function Level of Independence: Independent with assistive device(s)      Comments: pt was using a SPC to ambulate, 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   PT Goals (current goals can now be found in the care plan section) Acute Rehab PT Goals Patient Stated Goal: return home and return to work PT Goal Formulation: With patient Time For Goal Achievement:  07/14/17 Potential to Achieve Goals: Good Progress towards PT goals: Progressing toward goals    Frequency    7X/week      PT Plan Current plan remains appropriate    Co-evaluation              AM-PAC PT "6 Clicks" Daily Activity  Outcome Measure  Difficulty turning over in bed (including adjusting bedclothes, sheets and blankets)?: None Difficulty moving from lying on back to sitting on the side of the bed? : None Difficulty sitting down on and standing up from a chair with arms (e.g., wheelchair, bedside commode, etc,.)?: A Little Help needed moving to and from a bed to chair (including a wheelchair)?: None Help needed walking in hospital room?: None Help needed climbing 3-5 steps with a railing? : A Little 6 Click Score: 22    End of Session   Activity Tolerance: Patient tolerated treatment well Patient left: in bed;with call bell/phone within reach;with SCD's reapplied Nurse Communication: Mobility status PT Visit Diagnosis: Other abnormalities of gait and mobility (R26.89);Pain Pain - Right/Left: Left Pain - part of body: Hip     Time: 1441-1459 PT Time Calculation (min) (ACUTE ONLY): 18 min  Charges:  $Gait Training: 8-22 mins                    G Codes:       [place], [place], [place] [phone]    Alessandra Bevels Taryn Nave 06/30/2017, 4:51 PM

## 2017-07-01 MED ORDER — OXYCODONE HCL 5 MG PO TABS: 5.0000 mg | ORAL_TABLET | ORAL | 0 refills | Status: AC | PRN

## 2017-07-01 MED ORDER — ASPIRIN 81 MG PO CHEW: 81.0000 mg | CHEWABLE_TABLET | Freq: Two times a day (BID) | ORAL | 0 refills | Status: AC

## 2017-07-01 MED ORDER — METHOCARBAMOL 500 MG PO TABS: 500.0000 mg | ORAL_TABLET | Freq: Four times a day (QID) | ORAL | 1 refills | Status: AC | PRN

## 2017-07-01 NOTE — Progress Notes (Signed)
Occupational Therapy Treatment Patient Details Name: Maria Boyle MRN: [id number] DOB: 04/23/1960 Today's Date: 07/01/2017    History of present illness Pt is a 57 y/o female s/p L THA, direct anterior approach. PMH including but not limited to HTN and hypothyroidism.   OT comments  Pt making good progress with functional goals. OT will continue to follow acutely  Follow Up Recommendations  Follow surgeon's recommendation for DC plan and follow-up therapies;Supervision/Assistance - 24 hour    Equipment Recommendations  None recommended by OT    Recommendations for Other Services      Precautions / Restrictions Precautions Precautions: None Restrictions Weight Bearing Restrictions: Yes LLE Weight Bearing: Weight bearing as tolerated       Mobility Bed Mobility Overal bed mobility: Modified Independent                Transfers Overall transfer level: Modified independent Equipment used: Rolling walker (2 wheeled)                  Balance Overall balance assessment: Needs assistance Sitting-balance support: Feet supported Sitting balance-Leahy Scale: Good     Standing balance support: During functional activity;No upper extremity supported Standing balance-Leahy Scale: Fair                             ADL either performed or assessed with clinical judgement   ADL Overall ADL's : Needs assistance/impaired     Grooming: Wash/dry hands;Standing;Wash/dry face;Modified independent       Lower Body Bathing: Min guard;Sit to/from stand       Lower Body Dressing: Min guard;Sit to/from stand   Toilet Transfer: Ambulation;Grab bars;Modified Independent;Comfort height toilet;RW   Toileting- Clothing Manipulation and Hygiene: Sit to/from stand;Supervision/safety   Tub/ Engineer, structural: Min guard;Ambulation;Rolling walker;3 in 1   Functional mobility during ADLs: Min guard;Rolling walker       Vision Patient Visual Report: No change  from baseline     Perception     Praxis      Cognition Arousal/Alertness: Awake/alert Behavior During Therapy: WFL for tasks assessed/performed Overall Cognitive Status: Within Functional Limits for tasks assessed                                          Exercises     Shoulder Instructions       General Comments      Pertinent Vitals/ Pain       Pain Assessment: 0-10 Pain Score: 6  Pain Location: L hip Pain Descriptors / Indicators: Sore Pain Intervention(s): Premedicated before session;Monitored during session;Repositioned  Home Living                                          Prior Functioning/Environment              Frequency  Min 2X/week        Progress Toward Goals  OT Goals(current goals can now be found in the care plan section)  Progress towards OT goals: Progressing toward goals     Plan Discharge plan remains appropriate    Co-evaluation                 AM-PAC PT "6 Clicks" Daily Activity     Outcome  Measure   Help from another person eating meals?: None Help from another person taking care of personal grooming?: None Help from another person toileting, which includes using toliet, bedpan, or urinal?: A Little Help from another person bathing (including washing, rinsing, drying)?: A Little Help from another person to put on and taking off regular upper body clothing?: None Help from another person to put on and taking off regular lower body clothing?: A Little 6 Click Score: 21    End of Session Equipment Utilized During Treatment: Gait belt;Rolling walker;Other (comment)(3 in 1)  OT Visit Diagnosis: Other abnormalities of gait and mobility (R26.89)   Activity Tolerance Patient tolerated treatment well   Patient Left in bed(sitting EOB on phone)   Nurse Communication      Functional Assessment Tool Used: AM-PAC 6 Clicks Daily Activity   Time: 2956-2130 OT Time Calculation (min): 25  min  Charges: OT G-codes **NOT FOR INPATIENT CLASS** Functional Assessment Tool Used: AM-PAC 6 Clicks Daily Activity OT General Charges $OT Visit: 1 Visit OT Treatments $Self Care/Home Management : 8-22 mins $Therapeutic Activity: 8-22 mins     Galen Manila 07/01/2017, 2:22 PM

## 2017-07-01 NOTE — Progress Notes (Signed)
Subjective: 2 Days Post-Op Procedure(s) (LRB): LEFT TOTAL HIP ARTHROPLASTY ANTERIOR APPROACH (Left) Patient reports pain as moderate.    Objective: Vital signs in last 24 hours: Temp:  [99.4 F (37.4 C)-99.5 F (37.5 C)] 99.4 F (37.4 C) (05/30 0358) Pulse Rate:  [57-76] 76 (05/30 0358) Resp:  [16] 16 (05/29 1236) BP: (96-123)/(53-80) 96/53 (05/30 0358) SpO2:  [100 %] 100 % (05/30 0358)  Intake/Output from previous day: 05/29 0701 - 05/30 0700 In: 945 [P.O.:120; I.V.:825] Out: -  Intake/Output this shift: No intake/output data recorded.  Recent Labs    06/30/17 0448  HGB 11.3*   Recent Labs    06/30/17 0448  WBC 11.2*  RBC 3.64*  HCT 36.2  PLT 166   Recent Labs    06/30/17 0448  NA 144  K 4.1  CL 108  CO2 28  BUN 8  CREATININE 0.91  GLUCOSE 112*  CALCIUM 9.6   No results for input(s): LABPT, INR in the last 72 hours.  Sensation intact distally Intact pulses distally Dorsiflexion/Plantar flexion intact Incision: scant drainage   Assessment/Plan: 2 Days Post-Op Procedure(s) (LRB): LEFT TOTAL HIP ARTHROPLASTY ANTERIOR APPROACH (Left) Up with therapy Plan for discharge tomorrow Discharge home with home health    Kathryne Hitch 07/01/2017, 11:42 AM

## 2017-07-01 NOTE — Progress Notes (Signed)
Physical Therapy Treatment Patient Details Name: Maria Boyle MRN: [id number] DOB: 06-21-1960 Today's Date: 07/01/2017    History of Present Illness Pt is a 57 y/o female s/p L THA, direct anterior approach. PMH including but not limited to HTN and hypothyroidism.    PT Comments    Pt making good progress with functional mobility and successfully completed a second round of stair training. Pt is ready to d/c home with family support from a PT perspective.   Pt would continue to benefit from skilled physical therapy services at this time while admitted and after d/c to address the below listed limitations in order to improve overall safety and independence with functional mobility.    Follow Up Recommendations  Home health PT;Supervision - Intermittent     Equipment Recommendations  Rolling walker with 5" wheels    Recommendations for Other Services       Precautions / Restrictions Precautions Precautions: None Restrictions Weight Bearing Restrictions: Yes LLE Weight Bearing: Weight bearing as tolerated    Mobility  Bed Mobility Overal bed mobility: Modified Independent                Transfers Overall transfer level: Modified independent Equipment used: Rolling walker (2 wheeled)                Ambulation/Gait Ambulation/Gait assistance: Supervision Ambulation Distance (Feet): 200 Feet Assistive device: Rolling walker (2 wheeled) Gait Pattern/deviations: Step-through pattern;Decreased stride length;Decreased step length - right;Decreased stance time - left;Decreased weight shift to left Gait velocity: decreased Gait velocity interpretation: 1.31 - 2.62 ft/sec, indicative of limited community ambulator General Gait Details: no instability or LOB, no need for physical assisstance, supervision for safety with RW   Stairs Stairs: Yes Stairs assistance: Min guard Stair Management: Two rails;Step to pattern;Forwards Number of Stairs: 5 General stair  comments: cueing for sequencing, min guard for safety   Wheelchair Mobility    Modified Rankin (Stroke Patients Only)       Balance Overall balance assessment: Needs assistance Sitting-balance support: Feet supported Sitting balance-Leahy Scale: Good     Standing balance support: During functional activity;No upper extremity supported Standing balance-Leahy Scale: Fair                              Cognition Arousal/Alertness: Awake/alert Behavior During Therapy: WFL for tasks assessed/performed Overall Cognitive Status: Within Functional Limits for tasks assessed                                        Exercises      General Comments        Pertinent Vitals/Pain Pain Assessment: Faces Pain Score: 6  Faces Pain Scale: Hurts little more Pain Location: L hip Pain Descriptors / Indicators: Sore Pain Intervention(s): Monitored during session;Repositioned    Home Living                      Prior Function            PT Goals (current goals can now be found in the care plan section) Acute Rehab PT Goals PT Goal Formulation: With patient Time For Goal Achievement: 07/14/17 Potential to Achieve Goals: Good Progress towards PT goals: Progressing toward goals    Frequency    7X/week      PT Plan Current plan remains appropriate  Co-evaluation              AM-PAC PT "6 Clicks" Daily Activity  Outcome Measure  Difficulty turning over in bed (including adjusting bedclothes, sheets and blankets)?: None Difficulty moving from lying on back to sitting on the side of the bed? : None Difficulty sitting down on and standing up from a chair with arms (e.g., wheelchair, bedside commode, etc,.)?: A Little Help needed moving to and from a bed to chair (including a wheelchair)?: None Help needed walking in hospital room?: None Help needed climbing 3-5 steps with a railing? : A Little 6 Click Score: 22    End of Session    Activity Tolerance: Patient limited by pain;Patient limited by fatigue Patient left: in bed;with call bell/phone within reach Nurse Communication: Mobility status PT Visit Diagnosis: Other abnormalities of gait and mobility (R26.89);Pain Pain - Right/Left: Left Pain - part of body: Hip     Time: 1610-9604 PT Time Calculation (min) (ACUTE ONLY): 17 min  Charges:  $Gait Training: 8-22 mins                    G Codes:       [place], [place], [place] [phone]    Alessandra Bevels Tyjae Issa 07/01/2017, 2:56 PM

## 2017-07-01 NOTE — Plan of Care (Signed)
  Problem: Education: Goal: Knowledge of General Education information will improve Outcome: Progressing   Problem: Health Behavior/Discharge Planning: Goal: Ability to manage health-related needs will improve Outcome: Progressing   Problem: Clinical Measurements: Goal: Ability to maintain clinical measurements within normal limits will improve Outcome: Progressing Goal: Will remain free from infection Outcome: Progressing Goal: Diagnostic test results will improve Outcome: Progressing   Problem: Activity: Goal: Risk for activity intolerance will decrease Outcome: Progressing   Problem: Coping: Goal: Level of anxiety will decrease Outcome: Progressing   Problem: Pain Managment: Goal: General experience of comfort will improve Outcome: Progressing

## 2017-07-01 NOTE — Progress Notes (Signed)
Subjective: 2 Days Post-Op Procedure(s) (LRB): LEFT TOTAL HIP ARTHROPLASTY ANTERIOR APPROACH (Left) Patient reports pain as moderate.  No complaints otherwise.   Objective: Vital signs in last 24 hours: Temp:  [99.4 F (37.4 C)-99.5 F (37.5 C)] 99.4 F (37.4 C) (05/30 0358) Pulse Rate:  [57-76] 76 (05/30 0358) Resp:  [16] 16 (05/29 1236) BP: (96-123)/(53-80) 96/53 (05/30 0358) SpO2:  [100 %] 100 % (05/30 0358)  Intake/Output from previous day: 05/29 0701 - 05/30 0700 In: 945 [P.O.:120; I.V.:825] Out: -  Intake/Output this shift: No intake/output data recorded.  Recent Labs    06/30/17 0448  HGB 11.3*   Recent Labs    06/30/17 0448  WBC 11.2*  RBC 3.64*  HCT 36.2  PLT 166   Recent Labs    06/30/17 0448  NA 144  K 4.1  CL 108  CO2 28  BUN 8  CREATININE 0.91  GLUCOSE 112*  CALCIUM 9.6   No results for input(s): LABPT, INR in the last 72 hours.  Left lower extremity: Dorsiflexion/Plantar flexion intact Incision: scant drainage Compartment soft    Assessment/Plan: 2 Days Post-Op Procedure(s) (LRB): LEFT TOTAL HIP ARTHROPLASTY ANTERIOR APPROACH (Left) Up with therapy  Plan discharge home tomorrow.     GILBERT CLARK 07/01/2017, 11:27 AM

## 2017-07-01 NOTE — Discharge Instructions (Signed)

## 2017-07-01 NOTE — Progress Notes (Signed)
Physical Therapy Treatment Patient Details Name: Maria Boyle MRN: [id number] DOB: [birth year] Today's Date: 07/01/2017    History of Present Illness Pt is a 57 y/o female s/p L THA, direct anterior approach. PMH including but not limited to HTN and hypothyroidism.    PT Comments    Focus of session was on LE strengthening exercises as pt declining OOB activity secondary to pain. Pt reporting she had a "bad night" and was unable to sleep secondary to pain. PT discussed importance of movement with pt and instructed pt in bed level therex that she can perform throughout the day. PT will attempt second stair training session this afternoon with pt if appropriate.  Pt would continue to benefit from skilled physical therapy services at this time while admitted and after d/c to address the below listed limitations in order to improve overall safety and independence with functional mobility.    Follow Up Recommendations  Home health PT;Supervision - Intermittent     Equipment Recommendations  Rolling walker with 5" wheels    Recommendations for Other Services       Precautions / Restrictions Precautions Precautions: None Restrictions Weight Bearing Restrictions: Yes LLE Weight Bearing: Weight bearing as tolerated    Mobility  Bed Mobility Overal bed mobility: Modified Independent                Transfers                    Ambulation/Gait                 Stairs             Wheelchair Mobility    Modified Rankin (Stroke Patients Only)       Balance Overall balance assessment: Needs assistance Sitting-balance support: Feet supported Sitting balance-Leahy Scale: Good                                      Cognition Arousal/Alertness: Awake/alert Behavior During Therapy: WFL for tasks assessed/performed Overall Cognitive Status: Within Functional Limits for tasks assessed                                        Exercises Total Joint Exercises Quad Sets: AROM;Strengthening;Left;10 reps;Supine Heel Slides: AAROM;Left;10 reps;Supine Hip ABduction/ADduction: AAROM;Left;10 reps;Supine Straight Leg Raises: AAROM;Left;10 reps;Supine Bridges: AROM;Both;Other reps (comment);Supine(2)    General Comments        Pertinent Vitals/Pain Pain Assessment: Faces Faces Pain Scale: Hurts whole lot Pain Location: L hip Pain Descriptors / Indicators: Sore Pain Intervention(s): Monitored during session;Repositioned    Home Living                      Prior Function            PT Goals (current goals can now be found in the care plan section) Acute Rehab PT Goals PT Goal Formulation: With patient Time For Goal Achievement: 07/14/17 Potential to Achieve Goals: Good Progress towards PT goals: Progressing toward goals    Frequency    7X/week      PT Plan Current plan remains appropriate    Co-evaluation              AM-PAC PT "6 Clicks" Daily Activity  Outcome Measure  Difficulty turning over in bed (  including adjusting bedclothes, sheets and blankets)?: None Difficulty moving from lying on back to sitting on the side of the bed? : None Difficulty sitting down on and standing up from a chair with arms (e.g., wheelchair, bedside commode, etc,.)?: A Little Help needed moving to and from a bed to chair (including a wheelchair)?: None Help needed walking in hospital room?: None Help needed climbing 3-5 steps with a railing? : A Little 6 Click Score: 22    End of Session   Activity Tolerance: Patient limited by pain Patient left: in bed;with call bell/phone within reach Nurse Communication: Mobility status PT Visit Diagnosis: Other abnormalities of gait and mobility (R26.89);Pain Pain - Right/Left: Left Pain - part of body: Hip     Time: 8119-1478 PT Time Calculation (min) (ACUTE ONLY): 15 min  Charges:  $Therapeutic Exercise: 8-22 mins                    G  Codes:       [place], [place], DPT [phone]    Alessandra Bevels Tniya [place] 07/01/2017, 10:14 AM

## 2017-07-02 NOTE — Progress Notes (Signed)
Pt and spouse given discharge instructions along with paper prescriptions.  Pt and family instructed on medication administration, when to call MD, signs of infection and when to return to the hospital. Pt and family verbalized understanding.

## 2017-07-02 NOTE — Progress Notes (Signed)
Physical Therapy Treatment Patient Details Name: Maria Boyle MRN: [id number] DOB: 08/09/1960 Today's Date: 07/02/2017    History of Present Illness Pt is a 57 y/o female s/p L THA, direct anterior approach. PMH including but not limited to HTN and hypothyroidism.    PT Comments    Pt is eager and ready to go home. Pt moving well today with less pain. Verbally reviewed stairs and exercise program. Pt reported understanding.  Pt would continue to benefit from skilled physical therapy services at this time while admitted and after d/c to address the below listed limitations in order to improve overall safety and independence with functional mobility.    Follow Up Recommendations  Home health PT;Supervision - Intermittent     Equipment Recommendations  Rolling walker with 5" wheels    Recommendations for Other Services       Precautions / Restrictions Precautions Precautions: None Restrictions Weight Bearing Restrictions: Yes LLE Weight Bearing: Weight bearing as tolerated    Mobility  Bed Mobility               General bed mobility comments: pt standing in room with RW upon arrival  Transfers                    Ambulation/Gait Ambulation/Gait assistance: Supervision Ambulation Distance (Feet): 250 Feet Assistive device: Rolling walker (2 wheeled) Gait Pattern/deviations: Step-through pattern;Decreased stride length Gait velocity: decreased Gait velocity interpretation: 1.31 - 2.62 ft/sec, indicative of limited community ambulator General Gait Details: pt with improved gait pattern this session, steady with RW and good reciprocal gait   Stairs             Wheelchair Mobility    Modified Rankin (Stroke Patients Only)       Balance Overall balance assessment: Needs assistance Sitting-balance support: Feet supported Sitting balance-Leahy Scale: Good     Standing balance support: During functional activity;No upper extremity  supported Standing balance-Leahy Scale: Fair                              Cognition Arousal/Alertness: Awake/alert Behavior During Therapy: WFL for tasks assessed/performed Overall Cognitive Status: Within Functional Limits for tasks assessed                                        Exercises      General Comments        Pertinent Vitals/Pain Pain Assessment: Faces Faces Pain Scale: Hurts a little bit Pain Location: L hip Pain Descriptors / Indicators: Sore Pain Intervention(s): Monitored during session;Repositioned    Home Living                      Prior Function            PT Goals (current goals can now be found in the care plan section) Acute Rehab PT Goals PT Goal Formulation: With patient Time For Goal Achievement: 07/14/17 Potential to Achieve Goals: Good Progress towards PT goals: Progressing toward goals    Frequency    7X/week      PT Plan Current plan remains appropriate    Co-evaluation              AM-PAC PT "6 Clicks" Daily Activity  Outcome Measure  Difficulty turning over in bed (including adjusting bedclothes, sheets and blankets)?: None  Difficulty moving from lying on back to sitting on the side of the bed? : None Difficulty sitting down on and standing up from a chair with arms (e.g., wheelchair, bedside commode, etc,.)?: None Help needed moving to and from a bed to chair (including a wheelchair)?: None Help needed walking in hospital room?: None Help needed climbing 3-5 steps with a railing? : A Little 6 Click Score: 23    End of Session   Activity Tolerance: Patient tolerated treatment well Patient left: with call bell/phone within reach;with family/visitor present;Other (comment)(standing with RW at bedside) Nurse Communication: Mobility status PT Visit Diagnosis: Other abnormalities of gait and mobility (R26.89);Pain Pain - Right/Left: Left Pain - part of body: Hip     Time:  [id number] PT Time Calculation (min) (ACUTE ONLY): 11 min  Charges:  $Gait Training: 8-22 mins                    G Codes:       SpartaJennifer Nyriah Coote, [place], [place] [phone]    Maria BevelsJennifer M Hawkin Boyle 07/02/2017, 1:00 PM

## 2017-07-02 NOTE — Discharge Summary (Signed)
Patient ID: Maria Boyle MRN: [id number] DOB/AGE: 09/26/1960 57 y.o.  Admit date: 06/29/2017 Discharge date: 07/02/2017  Admission Diagnoses:  Principal Problem:   Unilateral primary osteoarthritis, left hip Active Problems:   Status post total replacement of left hip   Discharge Diagnoses:  Same  Past Medical History:  Diagnosis Date  . Hypertension   . Lactose intolerance   . Thyroid disease     Surgeries: Procedure(s): LEFT TOTAL HIP ARTHROPLASTY ANTERIOR APPROACH on 06/29/2017   Consultants:   Discharged Condition: Improved  Hospital Course: Maria Boyle is an 57 y.o. female who was admitted 06/29/2017 for operative treatment ofUnilateral primary osteoarthritis, left hip. Patient has severe unremitting pain that affects sleep, daily activities, and work/hobbies. After pre-op clearance the patient was taken to the operating room on 06/29/2017 and underwent  Procedure(s): LEFT TOTAL HIP ARTHROPLASTY ANTERIOR APPROACH.    Patient was given perioperative antibiotics:  Anti-infectives (From admission, onward)   Start     Dose/Rate Route Frequency Ordered Stop   06/29/17 2000  clindamycin (CLEOCIN) IVPB 600 mg     600 mg 100 mL/hr over 30 Minutes Intravenous Every 6 hours 06/29/17 1738 06/30/17 0245   06/29/17 1300  clindamycin (CLEOCIN) IVPB 900 mg     900 mg 100 mL/hr over 30 Minutes Intravenous To ShortStay [facility] 06/29/17 0642 06/29/17 1450       Patient was given sequential compression devices, early ambulation, and chemoprophylaxis to prevent DVT.  Patient benefited maximally from hospital stay and there were no complications.    Recent vital signs:  Patient Vitals for the past 24 hrs:  BP Temp Temp src Pulse Resp SpO2  07/02/17 0454 97/70 99 F (37.2 C) Oral 71 - 98 %  07/01/17 2012 (!) 103/55 99.6 F (37.6 C) Oral 72 - 97 %  07/01/17 1513 (!) 92/47 - - 69 18 98 %     Recent laboratory studies:  Recent Labs    06/30/17 0448  WBC 11.2*  HGB  11.3*  HCT 36.2  PLT 166  NA 144  K 4.1  CL 108  CO2 28  BUN 8  CREATININE 0.91  GLUCOSE 112*  CALCIUM 9.6     Discharge Medications:   Allergies as of 07/02/2017      Reactions   Penicillins Rash   Has patient had a PCN reaction causing immediate rash, facial/tongue/throat swelling, SOB or lightheadedness with hypotension: Unknown Has patient had a PCN reaction causing severe rash involving mucus membranes or skin necrosis: Unknown Has patient had a PCN reaction that required hospitalization: Unknown Has patient had a PCN reaction occurring within the last 10 years: No Childhood allergy If all of the above answers are "NO", then may proceed with Cephalosporin use.      Medication List    TAKE these medications   aspirin 81 MG chewable tablet Chew 1 tablet (81 mg total) by mouth 2 (two) times daily.   gabapentin 300 MG capsule Commonly known as:  NEURONTIN Take 2 capsules (600 mg total) by mouth at bedtime.   hydrochlorothiazide 25 MG tablet Commonly known as:  HYDRODIURIL Take 1 tablet (25 mg total) by mouth daily.   levothyroxine 75 MCG tablet Commonly known as:  SYNTHROID, LEVOTHROID Take 1 tablet (75 mcg total) by mouth daily.   methocarbamol 500 MG tablet Commonly known as:  ROBAXIN Take 1 tablet (500 mg total) by mouth every 6 (six) hours as needed for muscle spasms.   oxyCODONE 5 MG immediate release tablet Commonly known  as:  Oxy IR/ROXICODONE Take 1-2 tablets (5-10 mg total) by mouth every 4 (four) hours as needed for moderate pain (pain score 4-6).            Durable Medical Equipment  (From admission, onward)        Start     Ordered   06/29/17 1739  DME Walker rolling  Once    Question:  Patient needs a walker to treat with the following condition  Answer:  Status post total replacement of left hip   06/29/17 1738   06/29/17 1739  DME 3 n 1  Once     06/29/17 1738      Diagnostic Studies: Dg Pelvis Portable  Result Date:  06/29/2017 CLINICAL DATA:  Followup left hip replacement. EXAM: PORTABLE PELVIS 1-2 VIEWS COMPARISON:  Earlier same day. FINDINGS: Total hip replacement on the left. Components appear well positioned. No radiographically detectable complication. Previous hip arthroplasty on the right. IMPRESSION: New hip arthroplasty on the left has a good appearance. Electronically Signed   By: Paulina FusiMark  Shogry M.D.   On: 06/29/2017 16:14   Dg C-arm 1-60 Min  Result Date: 06/29/2017 CLINICAL DATA:  Left hip replacement anterior approach EXAM: DG C-ARM 61-120 MIN; OPERATIVE LEFT HIP WITH PELVIS COMPARISON:  None. FINDINGS: Left hip replacement in satisfactory position alignment. No acute complication. Right hip replacement also noted.  This was pre-existing. IMPRESSION: Satisfactory left hip replacement. Electronically Signed   By: Marlan Palauharles  Clark M.D.   On: 06/29/2017 15:49   Dg Hip Operative Unilat With Pelvis Left  Result Date: 06/29/2017 CLINICAL DATA:  Left hip replacement anterior approach EXAM: DG C-ARM 61-120 MIN; OPERATIVE LEFT HIP WITH PELVIS COMPARISON:  None. FINDINGS: Left hip replacement in satisfactory position alignment. No acute complication. Right hip replacement also noted.  This was pre-existing. IMPRESSION: Satisfactory left hip replacement. Electronically Signed   By: Marlan Palauharles  Clark M.D.   On: 06/29/2017 15:49   Xr Hip Unilat W Or W/o Pelvis 1v Left  Result Date: 06/15/2017 An AP pelvis and lateral left hip shows a total hip arthroplasty on the right side with no complicating features.  Severe end-stage arthritis of the left side.  This compared to films from a year ago which still show severe end-stage arthritis of left hip.  There are cystic changes in the femoral head and acetabulum.  There is complete loss of the joint space as well.  There is also sclerotic changes.   Disposition: Discharge disposition: 01-Home or Self Care         Follow-up Information    [facility].   Specialty:  Home Health Services Why:  A representative from [facility] will contact you to arrange start date and time for your therapy. Contact information: [address] [place] [address] [phone]        Kathryne HitchBlackman, Zaira Iacovelli Y, MD Follow up in 2 week(s).   Specialty:  Orthopedic Surgery Contact information: [address] [place] [address] [phone]            Signed: Kathryne HitchChristopher Y Clydell Sposito 07/02/2017, 6:43 AM

## 2017-07-02 NOTE — Progress Notes (Signed)
Patient ID: Maria Boyle, female   DOB: October 31, 1960, 57 y.o.   MRN: [id number] Doing well overall.  Can be discharged to home today.

## 2017-07-13 ENCOUNTER — Inpatient Hospital Stay (INDEPENDENT_AMBULATORY_CARE_PROVIDER_SITE_OTHER): Payer: Self-pay | Admitting: Orthopaedic Surgery

## 2017-07-13 ENCOUNTER — Inpatient Hospital Stay (INDEPENDENT_AMBULATORY_CARE_PROVIDER_SITE_OTHER): Payer: Self-pay | Admitting: Physician Assistant

## 2017-08-25 ENCOUNTER — Ambulatory Visit: Payer: Self-pay | Admitting: Family Medicine

## 2018-11-24 IMAGING — DX DG PORTABLE PELVIS
1 series · 1 of 1 positions shown · non-contrast
Comparison: Earlier same day.

CLINICAL DATA: Followup left hip replacement.

EXAM:
PORTABLE PELVIS 1-2 VIEWS

[pelvis ap]
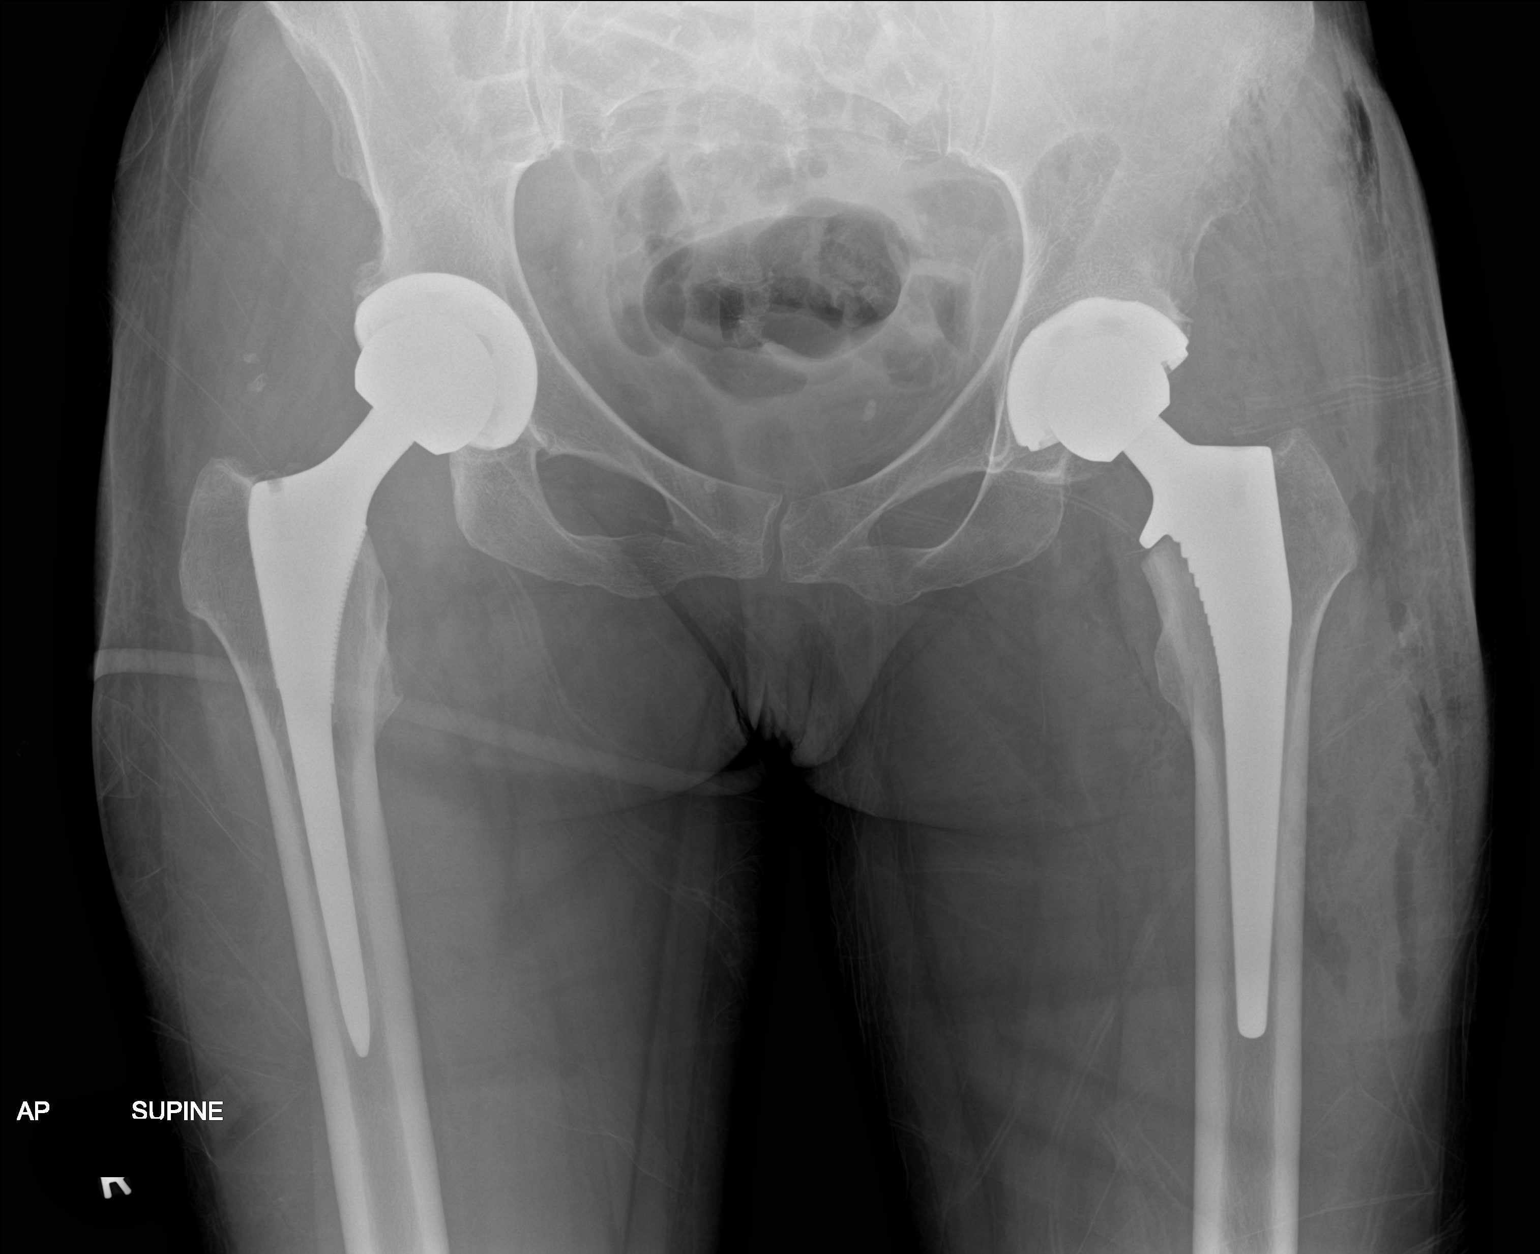

[1 of 1 positions shown; findings below may reference images not displayed]

FINDINGS: Total hip replacement on the left. Components appear well
positioned. No radiographically detectable complication. Previous
hip arthroplasty on the right.
IMPRESSION: New hip arthroplasty on the left has a good appearance.
